# Patient Record
Sex: Male | Born: 1940 | Hispanic: No | Marital: Married | Smoking: Never smoker
Health system: Southern US, Community
[De-identification: ages and names within clinical notes are randomized; demographics above are authoritative.]

## PROBLEM LIST (undated history)

## (undated) DIAGNOSIS — I10 Essential (primary) hypertension: Secondary | ICD-10-CM

## (undated) DIAGNOSIS — C801 Malignant (primary) neoplasm, unspecified: Secondary | ICD-10-CM

---

## 1999-01-28 ENCOUNTER — Emergency Department (HOSPITAL_COMMUNITY): Admission: EM | Admit: 1999-01-28 | Discharge: 1999-01-28 | Payer: Self-pay | Admitting: Emergency Medicine

## 1999-01-28 ENCOUNTER — Encounter: Payer: Self-pay | Admitting: Emergency Medicine

## 2001-10-10 ENCOUNTER — Emergency Department (HOSPITAL_COMMUNITY): Admission: EM | Admit: 2001-10-10 | Discharge: 2001-10-11 | Payer: Self-pay | Admitting: Emergency Medicine

## 2001-11-21 ENCOUNTER — Ambulatory Visit (HOSPITAL_BASED_OUTPATIENT_CLINIC_OR_DEPARTMENT_OTHER): Admission: RE | Admit: 2001-11-21 | Discharge: 2001-11-21 | Payer: Self-pay | Admitting: Surgery

## 2003-02-08 ENCOUNTER — Emergency Department (HOSPITAL_COMMUNITY): Admission: AD | Admit: 2003-02-08 | Discharge: 2003-02-08 | Payer: Self-pay | Admitting: Internal Medicine

## 2005-09-11 ENCOUNTER — Emergency Department (HOSPITAL_COMMUNITY): Admission: EM | Admit: 2005-09-11 | Discharge: 2005-09-11 | Payer: Self-pay | Admitting: Emergency Medicine

## 2005-10-22 ENCOUNTER — Encounter: Admission: RE | Admit: 2005-10-22 | Discharge: 2005-10-22 | Payer: Self-pay | Admitting: Family Medicine

## 2016-03-25 ENCOUNTER — Emergency Department (HOSPITAL_COMMUNITY)
Admission: EM | Admit: 2016-03-25 | Discharge: 2016-03-25 | Disposition: A | Discharge status: Home / Self Care | Payer: Worker's Compensation | Attending: Emergency Medicine | Admitting: Emergency Medicine

## 2016-03-25 ENCOUNTER — Emergency Department (HOSPITAL_COMMUNITY): Payer: Worker's Compensation

## 2016-03-25 ENCOUNTER — Encounter (HOSPITAL_COMMUNITY): Payer: Self-pay

## 2016-03-25 DIAGNOSIS — M25461 Effusion, right knee: Secondary | ICD-10-CM | POA: Insufficient documentation

## 2016-03-25 DIAGNOSIS — Y990 Civilian activity done for income or pay: Secondary | ICD-10-CM | POA: Insufficient documentation

## 2016-03-25 DIAGNOSIS — I10 Essential (primary) hypertension: Secondary | ICD-10-CM | POA: Diagnosis not present

## 2016-03-25 DIAGNOSIS — X509XXA Other and unspecified overexertion or strenuous movements or postures, initial encounter: Secondary | ICD-10-CM | POA: Insufficient documentation

## 2016-03-25 DIAGNOSIS — Y9289 Other specified places as the place of occurrence of the external cause: Secondary | ICD-10-CM | POA: Diagnosis not present

## 2016-03-25 DIAGNOSIS — M7989 Other specified soft tissue disorders: Secondary | ICD-10-CM | POA: Diagnosis present

## 2016-03-25 DIAGNOSIS — Y9389 Activity, other specified: Secondary | ICD-10-CM | POA: Diagnosis not present

## 2016-03-25 HISTORY — DX: Essential (primary) hypertension: I10

## 2016-03-25 NOTE — ED Provider Notes (Signed)
[facility] DEPT Provider Note   CSN: [id number] Arrival date & time: 03/25/16  1723  By signing my name below, I, Sonum Patel, attest that this documentation has been prepared under the direction and in the presence of Blanchie Dessert, MD. Electronically Signed: Sonum Patel, Education administrator. 03/25/16. 7:14 PM.  History   Chief Complaint Chief Complaint  Patient presents with  . Knee Pain     The history is provided by the patient. No language interpreter was used.     HPI Comments: Terrence Rivera is a 76 y.o. male who presents to the Emergency Department complaining of constant right knee pain with associated swelling that began 6 days ago. He states he was pulling something when he heard a popping noise to the affected area. He states the pain is worse with ambulation and flexion of the knee. He has applied ice, used a knee brace, and taken OTC anti-inflammatories with mild relief. He denies numbness, weakness.   Past Medical History:  Diagnosis Date  . Hypertension     There are no active problems to display for this patient.   History reviewed. No pertinent surgical history.     Home Medications    Prior to Admission medications   Not on File    Family History History reviewed. No pertinent family history.  Social History Social History  Substance Use Topics  . Smoking status: Never Smoker  . Smokeless tobacco: Never Used  . Alcohol use Yes     Comment: occ/rare     Allergies   Patient has no known allergies.   Review of Systems Review of Systems  Musculoskeletal: Positive for arthralgias and joint swelling.  Neurological: Negative for weakness and numbness.  All other systems reviewed and are negative.    Physical Exam Updated Vital Signs BP (!) 202/115 (BP Location: Left Arm)   Pulse 93   Temp 98.6 F (37 C) (Oral)   Resp 18   Ht 5\' 7"  (1.702 m)   Wt 158 lb 12.8 oz (72 kg)   SpO2 97%   BMI 24.87 kg/m   Physical Exam    Constitutional: He is oriented to person, place, and time. He appears well-developed and well-nourished.  HENT:  Head: Normocephalic and atraumatic.  Cardiovascular: Normal rate.   Pulmonary/Chest: Effort normal.  Musculoskeletal: He exhibits tenderness.  Right knee has suprapatellar swelling and tenderness over medial joint line. Able to flex past 90 degrees. No popliteal tenderness. Normal sensation and movement of the right foot. Normal strength of the quadriceps.   Neurological: He is alert and oriented to person, place, and time.  Skin: Skin is warm and dry.  Psychiatric: He has a normal mood and affect.  Nursing note and vitals reviewed.    ED Treatments / Results  DIAGNOSTIC STUDIES: Oxygen Saturation is 97% on RA, normal by my interpretation.    COORDINATION OF CARE: 7:15 PM Discussed treatment plan with pt at bedside and pt agreed to plan.   Labs (all labs ordered are listed, but only abnormal results are displayed) Labs Reviewed - No data to display  EKG  EKG Interpretation None       Radiology Dg Knee Complete 4 Views Right  Result Date: 03/25/2016 CLINICAL DATA:  [place] on RIGHT knee, pain and swelling. EXAM: RIGHT KNEE - COMPLETE 4+ VIEW COMPARISON:  None. FINDINGS: No acute fracture deformity or dislocation. No advanced degenerative change for age. No destructive bony lesions. Faint periarticular calcifications compatible with CPPD. Large suprapatellar joint effusion. Minimal  vascular calcifications. IMPRESSION: Large suprapatellar joint effusion.  No acute osseous process. Electronically Signed   By: Elon Alas M.D.   On: 03/25/2016 18:21    Procedures Procedures (including critical care time)  Medications Ordered in ED Medications - No data to display   Initial Impression / Assessment and Plan / ED Course  I have reviewed the triage vital signs and the nursing notes.  Pertinent labs & imaging results that were available during my care of the  patient were reviewed by me and considered in my medical decision making (see chart for details).     Patient with injury to his right knee while at work 6 days ago. He has had persistent knee swelling and pain with flexing the knee and walking. He has been using a knee sleeve and intermittent ibuprofen as well as ice with no significant improvement. He denies any weakness in the leg and is able to ambulate. X-ray shows a large suprapatellar joint effusion which is present on exam. Patient can flex the knee to approximately 90 and has no evidence of deformity. No signs of a septic joint and no evidence of dislocation or fracture. Most likely bad sprain. Instructed patient to wear his knee sleeve continuously and to ice and elevate when he can. Given follow-up with orthopedics. Patient was also hypertensive when he arrived here. He has not taken his lisinopril today but will recheck his blood pressure.  Which was 187/100 and told pt to take his lisinopril upon arrival home.  Final Clinical Impressions(s) / ED Diagnoses   Final diagnoses:  Effusion of right knee  Essential hypertension    New Prescriptions New Prescriptions   No medications on file   I personally performed the services described in this documentation, which was scribed in my presence.  The recorded information has been reviewed and considered.    Blanchie Dessert, MD 03/25/16 2018

## 2016-03-25 NOTE — ED Triage Notes (Signed)
Pt reports right knee pain, possible injury at work. Pt states he heard a "pop." Some mild swelling noted and patient unable to bend the knee without pain.

## 2016-03-25 NOTE — Discharge Instructions (Signed)
Wear your knee brace constantly and ice and elevate when you can.  Take Aleve 2 times a day for 5 days.  Take your lisinopril when you get home for your blood pressure

## 2016-04-27 ENCOUNTER — Encounter (INDEPENDENT_AMBULATORY_CARE_PROVIDER_SITE_OTHER): Payer: Self-pay | Admitting: Orthopaedic Surgery

## 2016-04-27 ENCOUNTER — Ambulatory Visit (INDEPENDENT_AMBULATORY_CARE_PROVIDER_SITE_OTHER): Payer: Worker's Compensation | Admitting: Orthopaedic Surgery

## 2016-04-27 DIAGNOSIS — M11261 Other chondrocalcinosis, right knee: Secondary | ICD-10-CM | POA: Diagnosis not present

## 2016-04-27 NOTE — Progress Notes (Signed)
   Office Visit Note   Patient: Terrence Rivera           Date of Birth: March 10, 1940           MRN: [id number] Visit Date: 04/27/2016              Requested by: No referring provider defined for this encounter. PCP: No PCP Per Patient   Assessment & Plan: Visit Diagnoses:  1. Pseudogout of right knee     Plan: knee aspirated and injected.  CPPD flare up.  Out of work this week.  F/u prn.    Follow-Up Instructions: Return if symptoms worsen or fail to improve.   Orders:  No orders of the defined types were placed in this encounter.  No orders of the defined types were placed in this encounter.     Procedures: Large Joint Inj Date/Time: 04/27/2016 2:05 PM Performed by: Leandrew Koyanagi Authorized by: Leandrew Koyanagi   Consent Given by:  Patient Timeout: prior to procedure the correct patient, procedure, and site was verified   Indications:  Pain Location:  Knee Site:  R knee Prep: patient was prepped and draped in usual sterile fashion   Needle Size:  22 G Ultrasound Guidance: No   Fluoroscopic Guidance: No   Arthrogram: No   Aspiration Attempted: Yes   Aspirate amount (mL):  55 Aspirate:  Clear Patient tolerance:  Patient tolerated the procedure well with no immediate complications     Clinical Data: No additional findings.   Subjective: Chief Complaint  Patient presents with  . Right Knee - Pain    76 yo male here for right knee pain and swelling since 03/25/16.  He injured his knee at work.  He's had swelling, pain, cannot bend knee and limping.  Currently doing light duty.  Has used ice with some relief.      Review of Systems  Constitutional: Negative.   All other systems reviewed and are negative.    Objective: Vital Signs: There were no vitals taken for this visit.  Physical Exam  Constitutional: He is oriented to person, place, and time. He appears well-developed and well-nourished.  HENT:  Head: Normocephalic and atraumatic.  Eyes:  Pupils are equal, round, and reactive to light.  Neck: Neck supple.  Pulmonary/Chest: Effort normal.  Abdominal: Soft.  Musculoskeletal: Normal range of motion.  Neurological: He is alert and oriented to person, place, and time.  Skin: Skin is warm.  Psychiatric: He has a normal mood and affect. His behavior is normal. Judgment and thought content normal.  Nursing note and vitals reviewed.   Ortho Exam Right knee - large effusion - otherwise benign exam  Specialty Comments:  No specialty comments available.  Imaging: No results found.   PMFS History: Patient Active Problem List   Diagnosis Date Noted  . Pseudogout of right knee 04/27/2016   Past Medical History:  Diagnosis Date  . Hypertension     No family history on file.  No past surgical history on file. Social History   Occupational History  . Not on file.   Social History Main Topics  . Smoking status: Never Smoker  . Smokeless tobacco: Never Used  . Alcohol use Yes     Comment: occ/rare  . Drug use: Unknown  . Sexual activity: Not on file

## 2016-04-27 NOTE — Addendum Note (Signed)
Addended by: Precious Bard on: 04/27/2016 04:46 PM   Modules accepted: Orders

## 2016-04-28 LAB — SYNOVIAL CELL COUNT + DIFF, W/ CRYSTALS
Basophils, %: 0 %
Eosinophils-Synovial: 0 % (ref 0–2)
Lymphocytes-Synovial Fld: 57 % (ref 0–74)
MONOCYTE/MACROPHAGE: 27 % (ref 0–69)
Neutrophil, Synovial: 14 % (ref 0–24)
SYNOVIOCYTES, %: 2 % (ref 0–15)
WBC, SYNOVIAL: 140 {cells}/uL (ref <150)

## 2016-04-30 ENCOUNTER — Telehealth (INDEPENDENT_AMBULATORY_CARE_PROVIDER_SITE_OTHER): Payer: Self-pay | Admitting: Orthopaedic Surgery

## 2016-04-30 NOTE — Telephone Encounter (Signed)
JAMIE WEBB, P.A. @ [facility] CALLED REQUESTING NOTES. FAXED [phone]. [place] [phone]

## 2016-05-08 LAB — BODY FLUID CULTURE
GRAM STAIN: NONE SEEN
GRAM STAIN: NONE SEEN
Organism ID, Bacteria: NO GROWTH

## 2018-08-22 DIAGNOSIS — R040 Epistaxis: Secondary | ICD-10-CM | POA: Insufficient documentation

## 2019-06-13 ENCOUNTER — Emergency Department (HOSPITAL_COMMUNITY): Payer: BC Managed Care – PPO

## 2019-06-13 ENCOUNTER — Other Ambulatory Visit: Payer: Self-pay

## 2019-06-13 ENCOUNTER — Encounter (HOSPITAL_COMMUNITY): Payer: Self-pay | Admitting: Pediatrics

## 2019-06-13 ENCOUNTER — Emergency Department (HOSPITAL_COMMUNITY)
Admission: EM | Admit: 2019-06-13 | Discharge: 2019-06-14 | Disposition: A | Discharge status: Home / Self Care | Payer: BC Managed Care – PPO | Attending: Emergency Medicine | Admitting: Emergency Medicine

## 2019-06-13 DIAGNOSIS — S46911A Strain of unspecified muscle, fascia and tendon at shoulder and upper arm level, right arm, initial encounter: Secondary | ICD-10-CM | POA: Insufficient documentation

## 2019-06-13 DIAGNOSIS — X500XXA Overexertion from strenuous movement or load, initial encounter: Secondary | ICD-10-CM | POA: Diagnosis not present

## 2019-06-13 DIAGNOSIS — I10 Essential (primary) hypertension: Secondary | ICD-10-CM | POA: Diagnosis not present

## 2019-06-13 DIAGNOSIS — Y939 Activity, unspecified: Secondary | ICD-10-CM | POA: Insufficient documentation

## 2019-06-13 DIAGNOSIS — Z79899 Other long term (current) drug therapy: Secondary | ICD-10-CM | POA: Insufficient documentation

## 2019-06-13 DIAGNOSIS — Y999 Unspecified external cause status: Secondary | ICD-10-CM | POA: Insufficient documentation

## 2019-06-13 DIAGNOSIS — Y929 Unspecified place or not applicable: Secondary | ICD-10-CM | POA: Diagnosis not present

## 2019-06-13 DIAGNOSIS — R52 Pain, unspecified: Secondary | ICD-10-CM

## 2019-06-13 DIAGNOSIS — S4991XA Unspecified injury of right shoulder and upper arm, initial encounter: Secondary | ICD-10-CM | POA: Diagnosis present

## 2019-06-13 NOTE — ED Triage Notes (Signed)
Patient stated some car battery fell on his right shoulder approx 25 lbs. C/O pain; worst with movement

## 2019-06-13 NOTE — ED Notes (Signed)
Pt not responding for vitals  

## 2019-06-14 LAB — I-STAT CHEM 8, ED
BUN: 23 mg/dL (ref 8–23)
Calcium, Ion: 1.02 mmol/L — ABNORMAL LOW (ref 1.15–1.40)
Chloride: 102 mmol/L (ref 98–111)
Creatinine, Ser: 0.9 mg/dL (ref 0.61–1.24)
Glucose, Bld: 118 mg/dL — ABNORMAL HIGH (ref 70–99)
HCT: 41 % (ref 39.0–52.0)
Hemoglobin: 13.9 g/dL (ref 13.0–17.0)
Potassium: 3.6 mmol/L (ref 3.5–5.1)
Sodium: 137 mmol/L (ref 135–145)
TCO2: 29 mmol/L (ref 22–32)

## 2019-06-14 MED ORDER — LISINOPRIL 20 MG PO TABS
20.0000 mg | ORAL_TABLET | Freq: Once | ORAL | Status: AC
  Administered 2019-06-14: 20 mg via ORAL
  Filled 2019-06-14: qty 1

## 2019-06-14 MED ORDER — ACETAMINOPHEN 500 MG PO TABS
1000.0000 mg | ORAL_TABLET | Freq: Once | ORAL | Status: AC
Start: 2019-06-14 — End: 2019-06-14
  Administered 2019-06-14: 01:00:00 1000 mg via ORAL
  Filled 2019-06-14: qty 2

## 2019-06-14 MED ORDER — LISINOPRIL 20 MG PO TABS: 20.0000 mg | ORAL_TABLET | Freq: Every day | ORAL | 0 refills | Status: DC

## 2019-06-14 NOTE — ED Notes (Signed)
Pt c/o R shoulder pain, worse with movement after car battery fell on it. CMS intact. Pt hypertensive with SBP in 200s. Pt states he is supposed to take lisinopril but has been off of it for 1 month "because last time I went to the doctor, it cost me 200 dollars."

## 2019-06-14 NOTE — Discharge Instructions (Signed)
Puede tomar Motrin (Ibuprofen) o Aleve (Naproxen), Acetaminophen (Tylenol), crema para los musculos como SalonPas, Icy Hot, Bengay, etc. Puede estrechar, ponerce hielo o comprecion de calor, o que le den masaje. ° °

## 2019-06-14 NOTE — ED Provider Notes (Signed)
[facility] EMERGENCY DEPARTMENT Provider Note  CSN: [id number] Arrival date & time: 06/13/19 1544  Chief Complaint(s) Shoulder Pain  HPI Terrence Rivera is a 79 y.o. male with a history of hypertension on lisinopril who presents to the emergency department with right shoulder pain.  This occurred earlier today while he was trying to lift a battery off of the shelf.  Reports that while trying to lift it, he immediately felt pain in his right shoulder.  There was no associated trauma or falls.  Pain felt like a muscle spasm which has not gone away.  Worse with range of motion, palpation of the parascapular muscles on the right.  No numbness or tingling.  No neck pain or back pain.  No chest pain or shortness of breath.  He denies any other physical complaints.  HPI   Past Medical History Past Medical History:  Diagnosis Date  . Hypertension    Patient Active Problem List   Diagnosis Date Noted  . Pseudogout of right knee 04/27/2016   Home Medication(s) Prior to Admission medications   Medication Sig Start Date End Date Taking? Authorizing Provider  lisinopril (ZESTRIL) 20 MG tablet Take 1 tablet (20 mg total) by mouth daily. 06/14/19   Fatima Blank, MD                                                                                                                                    Past Surgical History History reviewed. No pertinent surgical history. Family History No family history on file.  Social History Social History   Tobacco Use  . Smoking status: Never Smoker  . Smokeless tobacco: Never Used  Substance Use Topics  . Alcohol use: Yes    Comment: occ/rare  . Drug use: Not on file   Allergies Patient has no known allergies.  Review of Systems Review of Systems All other systems are reviewed and are negative for acute change except as noted in the HPI  Physical Exam Vital Signs  I have reviewed the triage vital signs BP (!)  216/107   Pulse 82   Temp 98.3 F (36.8 C) (Oral)   Resp 18   Ht 5\' 6"  (1.676 m)   Wt 65.8 kg   SpO2 98%   BMI 23.40 kg/m   Physical Exam Vitals reviewed.  Constitutional:      General: He is not in acute distress.    Appearance: He is well-developed. He is not diaphoretic.  HENT:     Head: Normocephalic and atraumatic.     Jaw: No trismus.     Right Ear: External ear normal.     Left Ear: External ear normal.     Nose: Nose normal.  Eyes:     General: No scleral icterus.    Conjunctiva/sclera: Conjunctivae normal.  Neck:     Trachea: Phonation normal.  Cardiovascular:     Rate and  Rhythm: Normal rate and regular rhythm.  Pulmonary:     Effort: Pulmonary effort is normal. No respiratory distress.     Breath sounds: No stridor.  Abdominal:     General: There is no distension.  Musculoskeletal:        General: Normal range of motion.     Cervical back: Normal range of motion.     Thoracic back: Spasms and tenderness present. No bony tenderness.       Back:  Neurological:     Mental Status: He is alert and oriented to person, place, and time.  Psychiatric:        Behavior: Behavior normal.     ED Results and Treatments Labs (all labs ordered are listed, but only abnormal results are displayed) Labs Reviewed  I-STAT CHEM 8, ED - Abnormal; Notable for the following components:      Result Value   Glucose, Bld 118 (*)    Calcium, Ion 1.02 (*)    All other components within normal limits                                                                                                                         EKG  EKG Interpretation  Date/Time:    Ventricular Rate:    PR Interval:    QRS Duration:   QT Interval:    QTC Calculation:   R Axis:     Text Interpretation:        Radiology DG Shoulder Right  Result Date: 06/13/2019 CLINICAL DATA:  Heavy object fell on right shoulder.  Pain EXAM: RIGHT SHOULDER - 2+ VIEW COMPARISON:  None FINDINGS: Degenerative  changes in the right AC and glenohumeral joints with joint space narrowing and spurring. No acute bony abnormality. Specifically, no fracture, subluxation, or dislocation. IMPRESSION: Degenerative changes.  No acute bony abnormality. Electronically Signed   By: Rolm Baptise M.D.   On: 06/13/2019 17:26    Pertinent labs & imaging results that were available during my care of the patient were reviewed by me and considered in my medical decision making (see chart for details).  Medications Ordered in ED Medications  acetaminophen (TYLENOL) tablet 1,000 mg (1,000 mg Oral Given 06/14/19 0120)  lisinopril (ZESTRIL) tablet 20 mg (20 mg Oral Given 06/14/19 0120)  Procedures Procedures  (including critical care time)  Medical Decision Making / ED Course I have reviewed the nursing notes for this encounter and the patient's prior records (if available in EHR or on provided paperwork).   Terrence Rivera was evaluated in Emergency Department on 06/14/2019 for the symptoms described in the history of present illness. He was evaluated in the context of the global COVID-19 pandemic, which necessitated consideration that the patient might be at risk for infection with the SARS-CoV-2 virus that causes COVID-19. Institutional protocols and algorithms that pertain to the evaluation of patients at risk for COVID-19 are in a state of rapid change based on information released by regulatory bodies including the CDC and federal and state organizations. These policies and algorithms were followed during the patient's care in the ED.  Right shoulder pain consistent with muscle spasm/strain.  Plain film was obtained in triage and negative.  Symptomatic and supportive management care recommended.  Patient was noted to be hypertensive.  Reports that he has been off of his lisinopril for 1  month after he decided to fire his PCP.  He has not reestablished care with anybody yet.  Denies any chest pain or shortness of breath concerning for cardiac endorgan damage.  Chem-8 obtained in renal function intact.  Given dose of lisinopril here in the emergency department.  Will prescribe him 1 to 2 months worth of lisinopril until he is able to establish care with a primary care provider.        Final Clinical Impression(s) / ED Diagnoses Final diagnoses:  Muscle strain of right shoulder, initial encounter   The patient appears reasonably screened and/or stabilized for discharge and I doubt any other medical condition or other [facility] requiring further screening, evaluation, or treatment in the ED at this time prior to discharge. Safe for discharge with strict return precautions.  Disposition: Discharge  Condition: Good  I have discussed the results, Dx and Tx plan with the patient/family who expressed understanding and agree(s) with the plan. Discharge instructions discussed at length. The patient/family was given strict return precautions who verbalized understanding of the instructions. No further questions at time of discharge.    ED Discharge Orders         Ordered    lisinopril (ZESTRIL) 20 MG tablet  Daily     06/14/19 0215           Follow Up: Primary care provider  Schedule an appointment as soon as possible for a visit  If you do not have a primary care physician, contact [facility] at [phone] for referral      This chart was dictated using voice recognition software.  Despite best efforts to proofread,  errors can occur which can change the documentation meaning.   Fatima Blank, MD 06/14/19 [phone]

## 2019-06-14 NOTE — ED Notes (Signed)
Patient verbalizes understanding of discharge instructions, follow up care, and prescription medications. Opportunity for questioning and answers were provided. All questions answered completely. Armband removed by staff, pt discharged from ED.  Ambulatory from ED with strong, steady gait

## 2019-06-14 NOTE — ED Notes (Signed)
ED Provider at bedside. 

## 2019-06-14 NOTE — ED Notes (Addendum)
Meds given per MAR. Name/DOB verified with pt Istat chem drawn, labeled with 2 pt identifiers, and brought to lab

## 2020-09-19 IMAGING — DX DG SHOULDER 2+V*R*
3 series · 3 of 3 positions shown · non-contrast
Comparison: None

CLINICAL DATA: Heavy object fell on right shoulder.  Pain

EXAM:
RIGHT SHOULDER - 2+ VIEW

[w shoulder external right]
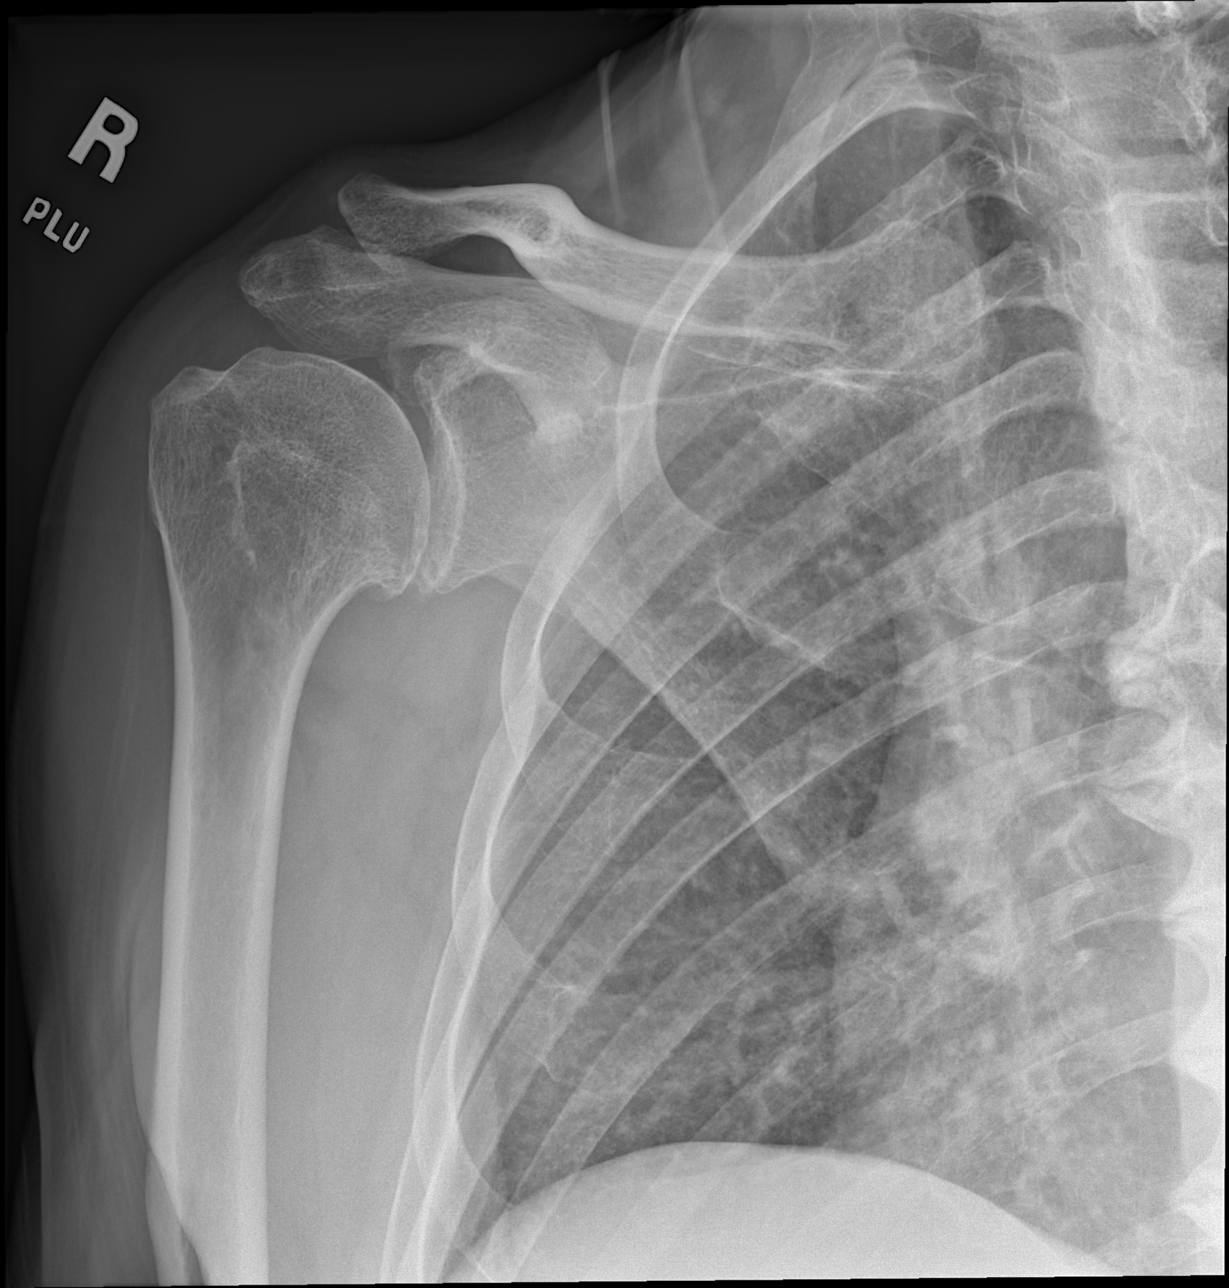

[w shoulder y-view right]
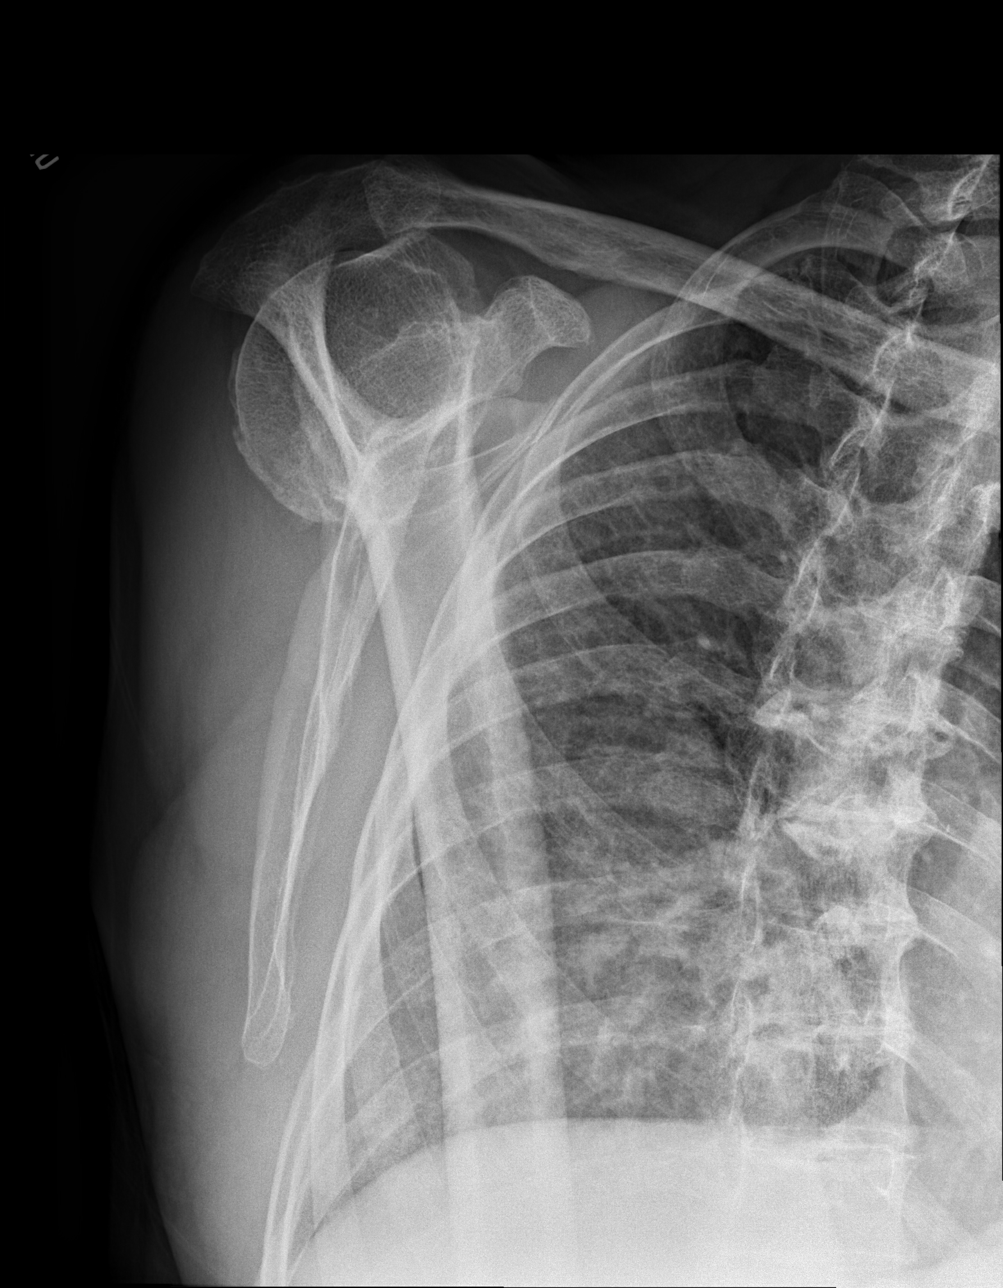

[x shoulder axillary right]
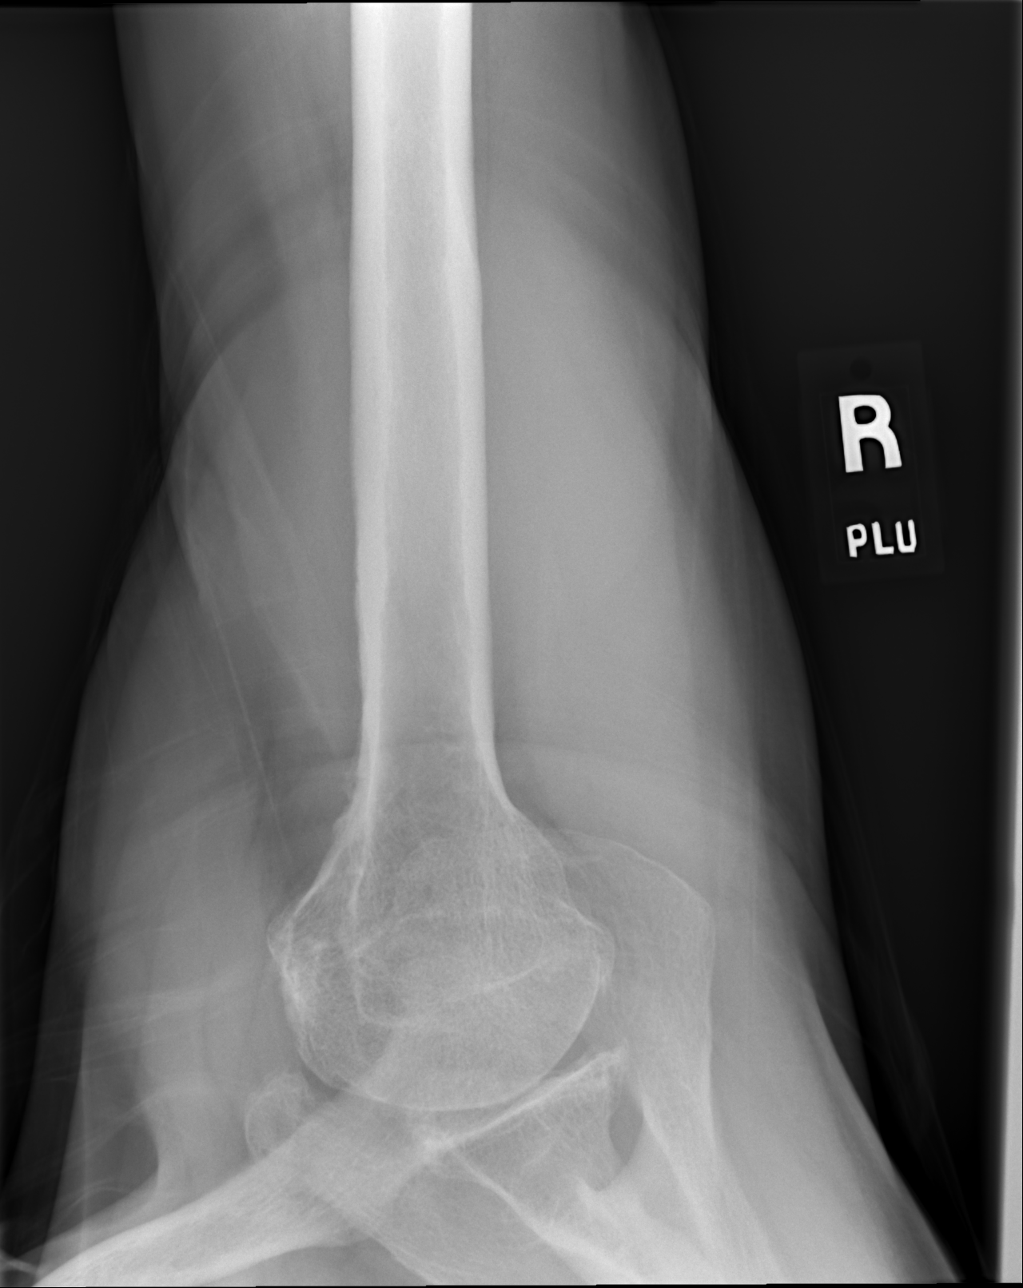

[3 of 3 positions shown; findings below may reference images not displayed]

FINDINGS: Degenerative changes in the right AC and glenohumeral joints with
joint space narrowing and spurring. No acute bony abnormality.
Specifically, no fracture, subluxation, or dislocation.
IMPRESSION: Degenerative changes.  No acute bony abnormality.

## 2021-09-17 ENCOUNTER — Other Ambulatory Visit: Payer: Self-pay | Admitting: Urology

## 2021-09-21 ENCOUNTER — Encounter (HOSPITAL_COMMUNITY): Payer: Self-pay

## 2021-09-21 ENCOUNTER — Encounter (HOSPITAL_COMMUNITY)
Admission: RE | Admit: 2021-09-21 | Discharge: 2021-09-21 | Disposition: A | Discharge status: Home / Self Care | Payer: Medicare (Managed Care) | Source: Ambulatory Visit | Attending: Urology | Admitting: Urology

## 2021-09-21 ENCOUNTER — Other Ambulatory Visit: Payer: Self-pay

## 2021-09-21 DIAGNOSIS — M11261 Other chondrocalcinosis, right knee: Secondary | ICD-10-CM | POA: Insufficient documentation

## 2021-09-21 DIAGNOSIS — Z01812 Encounter for preprocedural laboratory examination: Secondary | ICD-10-CM | POA: Insufficient documentation

## 2021-09-21 LAB — CBC
HCT: 36.1 % — ABNORMAL LOW (ref 39.0–52.0)
Hemoglobin: 11.5 g/dL — ABNORMAL LOW (ref 13.0–17.0)
MCH: 27.6 pg (ref 26.0–34.0)
MCHC: 31.9 g/dL (ref 30.0–36.0)
MCV: 86.8 fL (ref 80.0–100.0)
Platelets: 238 10*3/uL (ref 150–400)
RBC: 4.16 MIL/uL — ABNORMAL LOW (ref 4.22–5.81)
RDW: 15.9 % — ABNORMAL HIGH (ref 11.5–15.5)
WBC: 8.4 10*3/uL (ref 4.0–10.5)
nRBC: 0 % (ref 0.0–0.2)

## 2021-09-21 LAB — BASIC METABOLIC PANEL
Anion gap: 10 (ref 5–15)
BUN: 30 mg/dL — ABNORMAL HIGH (ref 8–23)
CO2: 23 mmol/L (ref 22–32)
Calcium: 9.4 mg/dL (ref 8.9–10.3)
Chloride: 103 mmol/L (ref 98–111)
Creatinine, Ser: 0.89 mg/dL (ref 0.61–1.24)
GFR, Estimated: >60 mL/min (ref >60)
Glucose, Bld: 112 mg/dL — ABNORMAL HIGH (ref 70–99)
Potassium: 4.2 mmol/L (ref 3.5–5.1)
Sodium: 136 mmol/L (ref 135–145)

## 2021-09-21 NOTE — Progress Notes (Addendum)
Anesthesia Review:  PCP: DR Kellie Shropshire in [facility]  Cardiologist : none  Chest x-ray : EKG :09/21/21  Echo : Stress test: Cardiac Cath :  Activity level: can do a flight of stairs wtihout difficutly  Sleep Study/ CPAP : none  Fasting Blood Sugar :      / Checks Blood Sugar -- times a day:   Blood Thinner/ Instructions /Last Dose: ASA / Instructions/ Last Dose :   Understands and speaks [place].  Understands most English except for medical terminology.  Interpreter requested DOS.  Wife to be here and does not speak [place].   PT was 20 minutres late for preop appt.

## 2021-09-21 NOTE — Progress Notes (Signed)
DUE TO COVID-19 ONLY ONE VISITOR IS ALLOWED TO COME WITH YOU AND STAY IN THE WAITING ROOM ONLY DURING PRE OP AND PROCEDURE DAY OF SURGERY.  2 VISITOR  MAY VISIT WITH YOU AFTER SURGERY IN YOUR PRIVATE ROOM DURING VISITING HOURS ONLY! YOU MAY HAVE ONE PERSON SPEND THE NITE WITH YOU IN YOUR ROOM AFTER SURGERY.     Your procedure is scheduled on:            09/23/2021   Report to [facility] Main  Entrance   Report to admitting at    0930             AM DO NOT BRING INSURANCE CARD, PICTURE ID OR WALLET DAY OF SURGERY.      Call this number if you have problems the morning of surgery [phone]    REMEMBER: NO  SOLID FOODS , CANDY, GUM OR MINTS AFTER [place] .       Marland Kitchen CLEAR LIQUIDS UNTIL   0830am              DAY OF SURGERY.       CLEAR LIQUID DIET   Foods Allowed      WATER BLACK COFFEE ( SUGAR [place], NO MILK, CREAM OR CREAMER) REGULAR AND DECAF  TEA ( [place] NO MILK, CREAM, OR CREAMER) REGULAR AND DECAF  PLAIN JELLO ( NO RED)  FRUIT ICES ( NO RED, NO FRUIT PULP)  POPSICLES ( NO RED)  JUICE- APPLE, WHITE GRAPE AND WHITE CRANBERRY  SPORT DRINK LIKE GATORADE ( NO RED)  CLEAR BROTH ( VEGETABLE , CHICKEN OR BEEF)                                                                     BRUSH YOUR TEETH MORNING OF SURGERY AND RINSE YOUR MOUTH OUT, NO CHEWING GUM CANDY OR MINTS.     Take these medicines the morning of surgery with A SIP OF WATER:    DO NOT TAKE ANY DIABETIC MEDICATIONS DAY OF YOUR SURGERY                               You may not have any metal on your body including hair pins and              piercings  Do not wear jewelry, make-up, lotions, powders or perfumes, deodorant             Do not wear nail polish on your fingernails.              IF YOU ARE A MALE AND WANT TO SHAVE UNDER ARMS OR LEGS PRIOR TO SURGERY YOU MUST DO SO AT LEAST 48 HOURS PRIOR TO SURGERY.              Men may shave face and neck.   Do not bring valuables to the  hospital. [place].  Contacts, dentures or bridgework may not be worn into surgery.  Leave suitcase in the car. After surgery it may be brought to your room.  Patients discharged the day of surgery will not be allowed to drive home. IF YOU ARE HAVING SURGERY AND GOING HOME THE SAME DAY, YOU MUST HAVE AN ADULT TO DRIVE YOU HOME AND BE WITH YOU FOR 24 HOURS. YOU MAY GO HOME BY TAXI OR UBER OR ORTHERWISE, BUT AN ADULT MUST ACCOMPANY YOU HOME AND STAY WITH YOU FOR 24 HOURS.                Please read over the following fact sheets you were given: _____________________________________________________________________  [facility] - Preparing for Surgery Before surgery, you can play an important role.  Because skin is not sterile, your skin needs to be as free of germs as possible.  You can reduce the number of germs on your skin by washing with CHG (chlorahexidine gluconate) soap before surgery.  CHG is an antiseptic cleaner which kills germs and bonds with the skin to continue killing germs even after washing. Please DO NOT use if you have an allergy to CHG or antibacterial soaps.  If your skin becomes reddened/irritated stop using the CHG and inform your nurse when you arrive at Short Stay. Do not shave (including legs and underarms) for at least 48 hours prior to the first CHG shower.  You may shave your face/neck. Please follow these instructions carefully:  1.  Shower with CHG Soap the night before surgery and the  morning of Surgery.  2.  If you choose to wash your hair, wash your hair first as usual with your  normal  shampoo.  3.  After you shampoo, rinse your hair and body thoroughly to remove the  shampoo.                           4.  Use CHG as you would any other liquid soap.  You can apply chg directly  to the skin and wash                       Gently with a scrungie or clean washcloth.  5.  Apply the CHG Soap to your body ONLY FROM  THE NECK DOWN.   Do not use on face/ open                           Wound or open sores. Avoid contact with eyes, ears mouth and genitals (private parts).                       Wash face,  Genitals (private parts) with your normal soap.             6.  Wash thoroughly, paying special attention to the area where your surgery  will be performed.  7.  Thoroughly rinse your body with warm water from the neck down.  8.  DO NOT shower/wash with your normal soap after using and rinsing off  the CHG Soap.                9.  Pat yourself dry with a clean towel.            10.  Wear clean pajamas.            11.  Place clean sheets on your bed the night of your first shower and do not  sleep with pets. Day of Surgery : Do not apply any lotions/deodorants the  morning of surgery.  Please wear clean clothes to the hospital/surgery center.  FAILURE TO FOLLOW THESE INSTRUCTIONS MAY RESULT IN THE CANCELLATION OF YOUR SURGERY PATIENT SIGNATURE_________________________________  NURSE SIGNATURE__________________________________  ________________________________________________________________________

## 2021-09-22 ENCOUNTER — Encounter (HOSPITAL_COMMUNITY): Payer: Self-pay | Admitting: Urology

## 2021-09-23 ENCOUNTER — Other Ambulatory Visit: Payer: Self-pay

## 2021-09-23 ENCOUNTER — Encounter (HOSPITAL_COMMUNITY)
Admission: RE | Disposition: A | Discharge status: Home / Self Care | Payer: Self-pay | Source: Home / Self Care | Attending: Urology

## 2021-09-23 ENCOUNTER — Ambulatory Visit (HOSPITAL_COMMUNITY): Payer: Medicare (Managed Care) | Admitting: Anesthesiology

## 2021-09-23 ENCOUNTER — Ambulatory Visit (HOSPITAL_BASED_OUTPATIENT_CLINIC_OR_DEPARTMENT_OTHER): Payer: Medicare (Managed Care) | Admitting: Anesthesiology

## 2021-09-23 ENCOUNTER — Observation Stay (HOSPITAL_COMMUNITY)
Admission: RE | Admit: 2021-09-23 | Discharge: 2021-09-24 | Disposition: A | Discharge status: Home / Self Care | Payer: Medicare (Managed Care) | Attending: Urology | Admitting: Urology

## 2021-09-23 ENCOUNTER — Encounter (HOSPITAL_COMMUNITY): Payer: Self-pay | Admitting: Urology

## 2021-09-23 DIAGNOSIS — C679 Malignant neoplasm of bladder, unspecified: Secondary | ICD-10-CM | POA: Diagnosis not present

## 2021-09-23 DIAGNOSIS — Z79899 Other long term (current) drug therapy: Secondary | ICD-10-CM | POA: Diagnosis not present

## 2021-09-23 DIAGNOSIS — M11261 Other chondrocalcinosis, right knee: Secondary | ICD-10-CM

## 2021-09-23 DIAGNOSIS — D494 Neoplasm of unspecified behavior of bladder: Secondary | ICD-10-CM

## 2021-09-23 DIAGNOSIS — R310 Gross hematuria: Secondary | ICD-10-CM | POA: Diagnosis not present

## 2021-09-23 DIAGNOSIS — D4959 Neoplasm of unspecified behavior of other genitourinary organ: Secondary | ICD-10-CM

## 2021-09-23 HISTORY — PX: TRANSURETHRAL RESECTION OF PROSTATE: SHX73

## 2021-09-23 HISTORY — PX: CYSTOSCOPY: SHX5120

## 2021-09-23 LAB — BASIC METABOLIC PANEL
Anion gap: 10 (ref 5–15)
BUN: 27 mg/dL — ABNORMAL HIGH (ref 8–23)
CO2: 24 mmol/L (ref 22–32)
Calcium: 8.7 mg/dL — ABNORMAL LOW (ref 8.9–10.3)
Chloride: 102 mmol/L (ref 98–111)
Creatinine, Ser: 0.99 mg/dL (ref 0.61–1.24)
GFR, Estimated: >60 mL/min (ref >60)
Glucose, Bld: 215 mg/dL — ABNORMAL HIGH (ref 70–99)
Potassium: 3.6 mmol/L (ref 3.5–5.1)
Sodium: 136 mmol/L (ref 135–145)

## 2021-09-23 SURGERY — CYSTOSCOPY
Anesthesia: General

## 2021-09-23 MED ORDER — OXYCODONE HCL 5 MG/5ML PO SOLN: 5.0000 mg | Freq: Once | ORAL | Status: DC | PRN

## 2021-09-23 MED ORDER — SENNOSIDES-DOCUSATE SODIUM 8.6-50 MG PO TABS
2.0000 | ORAL_TABLET | Freq: Every day | ORAL | Status: DC
Start: 2021-09-23 — End: 2021-09-24
  Administered 2021-09-23: 2 via ORAL
  Filled 2021-09-23: qty 2

## 2021-09-23 MED ORDER — ESMOLOL HCL 100 MG/10ML IV SOLN
INTRAVENOUS | Status: DC | PRN
  Administered 2021-09-23: 10 mg via INTRAVENOUS

## 2021-09-23 MED ORDER — CEFAZOLIN SODIUM-DEXTROSE 2-4 GM/100ML-% IV SOLN
INTRAVENOUS | Status: AC
  Filled 2021-09-23: qty 100

## 2021-09-23 MED ORDER — LIDOCAINE HCL (CARDIAC) PF 100 MG/5ML IV SOSY
PREFILLED_SYRINGE | INTRAVENOUS | Status: DC | PRN
  Administered 2021-09-23: 60 mg via INTRAVENOUS

## 2021-09-23 MED ORDER — SODIUM CHLORIDE 0.9 % IR SOLN
3000.0000 mL | Status: DC
Start: 2021-09-23 — End: 2021-09-24
  Administered 2021-09-23 – 2021-09-24 (×3): 3000 mL

## 2021-09-23 MED ORDER — OXYCODONE HCL 5 MG PO TABS: 5.0000 mg | ORAL_TABLET | Freq: Once | ORAL | Status: DC | PRN

## 2021-09-23 MED ORDER — SUGAMMADEX SODIUM 500 MG/5ML IV SOLN
INTRAVENOUS | Status: DC | PRN
  Administered 2021-09-23: 200 mg via INTRAVENOUS

## 2021-09-23 MED ORDER — ONDANSETRON HCL 4 MG/2ML IJ SOLN
INTRAMUSCULAR | Status: DC | PRN
  Administered 2021-09-23: 4 mg via INTRAVENOUS

## 2021-09-23 MED ORDER — SODIUM CHLORIDE 0.9 % IR SOLN
Status: DC | PRN
  Administered 2021-09-23 (×10): 3000 mL

## 2021-09-23 MED ORDER — FENTANYL CITRATE (PF) 100 MCG/2ML IJ SOLN
INTRAMUSCULAR | Status: AC
  Filled 2021-09-23: qty 2

## 2021-09-23 MED ORDER — FENTANYL CITRATE (PF) 100 MCG/2ML IJ SOLN
INTRAMUSCULAR | Status: DC | PRN
  Administered 2021-09-23 (×3): 50 ug via INTRAVENOUS

## 2021-09-23 MED ORDER — SODIUM CHLORIDE 0.9 % IV SOLN: INTRAVENOUS | Status: DC

## 2021-09-23 MED ORDER — CEFAZOLIN SODIUM-DEXTROSE 2-4 GM/100ML-% IV SOLN: 2.0000 g | INTRAVENOUS | Status: DC

## 2021-09-23 MED ORDER — ACETAMINOPHEN 325 MG PO TABS
650.0000 mg | ORAL_TABLET | ORAL | Status: DC | PRN
Start: 2021-09-23 — End: 2021-09-24

## 2021-09-23 MED ORDER — CHLORHEXIDINE GLUCONATE 0.12 % MT SOLN
15.0000 mL | Freq: Once | OROMUCOSAL | Status: AC
  Administered 2021-09-23: 15 mL via OROMUCOSAL

## 2021-09-23 MED ORDER — ORAL CARE MOUTH RINSE: 15.0000 mL | Freq: Once | OROMUCOSAL | Status: AC

## 2021-09-23 MED ORDER — PROPOFOL 10 MG/ML IV BOLUS
INTRAVENOUS | Status: DC | PRN
  Administered 2021-09-23: 20 mg via INTRAVENOUS
  Administered 2021-09-23: 100 mg via INTRAVENOUS
  Administered 2021-09-23: 30 mg via INTRAVENOUS

## 2021-09-23 MED ORDER — ONDANSETRON HCL 4 MG/2ML IJ SOLN
4.0000 mg | INTRAMUSCULAR | Status: DC | PRN
  Administered 2021-09-23: 4 mg via INTRAVENOUS
  Filled 2021-09-23: qty 2

## 2021-09-23 MED ORDER — HYDRALAZINE HCL 20 MG/ML IJ SOLN
5.0000 mg | INTRAMUSCULAR | Status: DC | PRN
  Administered 2021-09-23: 5 mg via INTRAVENOUS

## 2021-09-23 MED ORDER — MORPHINE SULFATE (PF) 2 MG/ML IV SOLN: 2.0000 mg | INTRAVENOUS | Status: DC | PRN

## 2021-09-23 MED ORDER — LACTATED RINGERS IV SOLN: INTRAVENOUS | Status: DC

## 2021-09-23 MED ORDER — HYDRALAZINE HCL 20 MG/ML IJ SOLN
INTRAMUSCULAR | Status: AC
  Filled 2021-09-23: qty 1

## 2021-09-23 MED ORDER — DIPHENHYDRAMINE HCL 50 MG/ML IJ SOLN: 12.5000 mg | Freq: Four times a day (QID) | INTRAMUSCULAR | Status: DC | PRN

## 2021-09-23 MED ORDER — ROCURONIUM BROMIDE 100 MG/10ML IV SOLN
INTRAVENOUS | Status: DC | PRN
  Administered 2021-09-23 (×3): 10 mg via INTRAVENOUS
  Administered 2021-09-23: 60 mg via INTRAVENOUS

## 2021-09-23 MED ORDER — ZOLPIDEM TARTRATE 5 MG PO TABS: 5.0000 mg | ORAL_TABLET | Freq: Every evening | ORAL | Status: DC | PRN

## 2021-09-23 MED ORDER — FENTANYL CITRATE PF 50 MCG/ML IJ SOSY: 25.0000 ug | PREFILLED_SYRINGE | INTRAMUSCULAR | Status: DC | PRN

## 2021-09-23 MED ORDER — ONDANSETRON HCL 4 MG/2ML IJ SOLN
4.0000 mg | Freq: Once | INTRAMUSCULAR | Status: DC | PRN
Start: 2021-09-23 — End: 2021-09-23

## 2021-09-23 MED ORDER — AMISULPRIDE (ANTIEMETIC) 5 MG/2ML IV SOLN: 10.0000 mg | Freq: Once | INTRAVENOUS | Status: DC | PRN

## 2021-09-23 MED ORDER — OXYBUTYNIN CHLORIDE 5 MG PO TABS: 5.0000 mg | ORAL_TABLET | Freq: Three times a day (TID) | ORAL | Status: DC | PRN

## 2021-09-23 MED ORDER — OXYCODONE HCL 5 MG PO TABS: 5.0000 mg | ORAL_TABLET | ORAL | Status: DC | PRN

## 2021-09-23 MED ORDER — CEFAZOLIN SODIUM-DEXTROSE 2-4 GM/100ML-% IV SOLN
2.0000 g | Freq: Three times a day (TID) | INTRAVENOUS | Status: AC
  Administered 2021-09-23 – 2021-09-24 (×2): 2 g via INTRAVENOUS
  Filled 2021-09-23 (×2): qty 100

## 2021-09-23 MED ORDER — PHENYLEPHRINE HCL (PRESSORS) 10 MG/ML IV SOLN
INTRAVENOUS | Status: DC | PRN
  Administered 2021-09-23: 80 ug via INTRAVENOUS

## 2021-09-23 MED ORDER — LISINOPRIL 20 MG PO TABS
20.0000 mg | ORAL_TABLET | Freq: Every day | ORAL | Status: DC
  Administered 2021-09-23: 20 mg via ORAL
  Filled 2021-09-23: qty 1

## 2021-09-23 MED ORDER — CEFAZOLIN SODIUM-DEXTROSE 2-3 GM-%(50ML) IV SOLR
INTRAVENOUS | Status: DC | PRN
  Administered 2021-09-23: 2 g via INTRAVENOUS

## 2021-09-23 MED ORDER — DIPHENHYDRAMINE HCL 12.5 MG/5ML PO ELIX: 12.5000 mg | ORAL_SOLUTION | Freq: Four times a day (QID) | ORAL | Status: DC | PRN

## 2021-09-23 MED ORDER — ACETAMINOPHEN 500 MG PO TABS: 1000.0000 mg | ORAL_TABLET | Freq: Once | ORAL | Status: DC

## 2021-09-23 SURGICAL SUPPLY — 24 items
BAG URINE DRAIN 2000ML AR STRL (UROLOGICAL SUPPLIES) ×3 IMPLANT
BAG URO CATCHER STRL LF (MISCELLANEOUS) ×3 IMPLANT
CATH FOLEY 2WAY SLVR  5CC 18FR (CATHETERS)
CATH FOLEY 2WAY SLVR 5CC 18FR (CATHETERS) IMPLANT
CATH FOLEY 3WAY 30CC 24FR (CATHETERS) ×3
CATH URTH STD 24FR FL 3W 2 (CATHETERS) ×2 IMPLANT
CLOTH BEACON ORANGE TIMEOUT ST (SAFETY) ×3 IMPLANT
DRAPE FOOT SWITCH (DRAPES) ×3 IMPLANT
ELECT REM PT RETURN 15FT ADLT (MISCELLANEOUS) ×3 IMPLANT
GLOVE BIO SURGEON STRL SZ7.5 (GLOVE) ×3 IMPLANT
GOWN STRL REUS W/ TWL XL LVL3 (GOWN DISPOSABLE) ×2 IMPLANT
GOWN STRL REUS W/TWL XL LVL3 (GOWN DISPOSABLE) ×3
HOLDER FOLEY CATH W/STRAP (MISCELLANEOUS) ×3 IMPLANT
KIT TURNOVER KIT A (KITS) ×1 IMPLANT
LOOP CUT BIPOLAR 24F LRG (ELECTROSURGICAL) ×3 IMPLANT
MANIFOLD NEPTUNE II (INSTRUMENTS) ×3 IMPLANT
PACK CYSTO (CUSTOM PROCEDURE TRAY) ×3 IMPLANT
PENCIL SMOKE EVACUATOR (MISCELLANEOUS) IMPLANT
PLUG CATH AND CAP STER (CATHETERS) IMPLANT
SYR 30ML LL (SYRINGE) ×3 IMPLANT
SYR TOOMEY IRRIG 70ML (MISCELLANEOUS) ×3
SYRINGE TOOMEY IRRIG 70ML (MISCELLANEOUS) ×2 IMPLANT
TUBING CONNECTING 10 (TUBING) ×3 IMPLANT
TUBING UROLOGY SET (TUBING) ×3 IMPLANT

## 2021-09-23 NOTE — Anesthesia Preprocedure Evaluation (Signed)
Anesthesia Evaluation  Patient identified by MRN, date of birth, ID band Patient awake    Reviewed: Allergy & Precautions, NPO status , Patient's Chart, lab work & pertinent test results  History of Anesthesia Complications Negative for: history of anesthetic complications  Airway Mallampati: II  TM Distance: >3 FB Neck ROM: Full    Dental  (+) Dental Advisory Given   Pulmonary neg pulmonary ROS,    Pulmonary exam normal        Cardiovascular hypertension, Normal cardiovascular exam     Neuro/Psych negative neurological ROS     GI/Hepatic negative GI ROS, Neg liver ROS,   Endo/Other  negative endocrine ROS  Renal/GU negative Renal ROS   Bladder tumor    Musculoskeletal negative musculoskeletal ROS (+)   Abdominal   Peds  Hematology  (+) Blood dyscrasia, anemia ,   Anesthesia Other Findings   Reproductive/Obstetrics                             Anesthesia Physical Anesthesia Plan  ASA: 2  Anesthesia Plan: General   Post-op Pain Management: Tylenol PO (pre-op)* and Toradol IV (intra-op)*   Induction: Intravenous  PONV Risk Score and Plan: 2 and Ondansetron, Dexamethasone, Midazolam and Treatment may vary due to age or medical condition  Airway Management Planned: LMA  Additional Equipment: None  Intra-op Plan:   Post-operative Plan: Extubation in OR  Informed Consent: I have reviewed the patients History and Physical, chart, labs and discussed the procedure including the risks, benefits and alternatives for the proposed anesthesia with the patient or authorized representative who has indicated his/her understanding and acceptance.     Dental advisory given  Plan Discussed with:   Anesthesia Plan Comments:         Anesthesia Quick Evaluation

## 2021-09-23 NOTE — Anesthesia Postprocedure Evaluation (Signed)
Anesthesia Post Note  Patient: Terrence Rivera  Procedure(s) Performed: CYSTOSCOPY TRANSURETHRAL RESECTION OF BLADDER TUMOR (TURBT) (Bilateral) TRANSURETHRAL RESECTION OF THE PROSTATE (TURP)     Patient location during evaluation: PACU Anesthesia Type: General Level of consciousness: awake and alert, oriented and patient cooperative Pain management: pain level controlled Vital Signs Assessment: post-procedure vital signs reviewed and stable Respiratory status: spontaneous breathing, nonlabored ventilation and respiratory function stable Cardiovascular status: blood pressure returned to baseline and stable Postop Assessment: no apparent nausea or vomiting Anesthetic complications: no   No notable events documented.  Last Vitals:  Vitals:   09/23/21 1445 09/23/21 1500  BP: (!) 155/72 (!) 162/74  Pulse: 65 66  Resp: 16 18  Temp: (!) 36.4 C   SpO2: 99% 100%    Last Pain:  Vitals:   09/23/21 1500  TempSrc:   PainSc: 0-No pain                 Pervis Hocking

## 2021-09-23 NOTE — H&P (Signed)
CC/HPI: CC: Gross hematuria  HPI:  06/04/2021  81 year old male with a month-long history of intermittent gross hematuria. May be occurring after intercourse. Somewhat poor historian. States that the blood is not in his semen. The blood is in his urine. He denies any associated voiding complaints. He does mention that he had some dysuria and was prescribed some antibiotics. However, continues to have the hematuria but no longer having any dysuria or voiding complaints.   07/30/2021  Patient underwent a CT hematuria protocol. This revealed multiple enhancing bladder masses. 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 Pulmonary referral was recommended. In the abdomen he had no lymphadenopathy or concerns for metastatic disease.   09/17/2021  Patient underwent cystoscopy at the last visit which revealed the following:   There was some impaired visualization in the bladder. However, there was tumor throughout the bladder including larger papillary tumor on the right lateral wall towards the bladder neck as well. There was a lot of tumor throughout the bladder.   We have not been able to get in touch with him. I sent him a letter. Fortunately, he comes in today and we can get him set up for surgery with my surgery scheduler here to discuss it with him. We again went over the risk and benefits. Translator used to set him up for surgery.     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 Drinks 2 caffeinated drinks per day. Has not had a blood transfusion.    REVIEW OF SYSTEMS:    GU Review Male:   Patient denies frequent urination, hard to postpone urination, burning/ pain with urination, get up at night to urinate, leakage of urine, stream starts and stops, trouble starting your stream, have to strain to urinate , erection problems, and penile pain.  Gastrointestinal (Upper):   Patient denies nausea, vomiting, and indigestion/ heartburn.  Gastrointestinal (Lower):   Patient denies diarrhea and constipation.  Constitutional:   Patient denies fever, night sweats, weight loss, and fatigue.  Skin:   Patient denies skin rash/ lesion and itching.  Eyes:   Patient denies blurred vision and double vision.  Ears/ Nose/ Throat:   Patient denies sore throat and sinus problems.  Hematologic/Lymphatic:   Patient denies swollen glands and easy bruising.  Cardiovascular:   Patient denies leg swelling and chest pains.  Respiratory:   Patient denies cough and shortness of breath.  Endocrine:   Patient denies excessive thirst.  Musculoskeletal:   Patient denies back pain and joint pain.  Neurological:   Patient denies headaches and dizziness.  Psychologic:   Patient denies depression and anxiety.   VITAL SIGNS: None   MULTI-SYSTEM PHYSICAL EXAMINATION:    Gastrointestinal: No mass, no tenderness, no rigidity, non obese abdomen.     Complexity of Data:  Source Of History:  Patient  Records Review:   Previous Doctor Records, Previous Patient Records  Urine Test Review:  Urinalysis   06/30/21  PSA  Total PSA 1.07 ng/mL    PROCEDURES:          Urinalysis w/Scope Dipstick Dipstick Cont'd Micro  Color: Red Bilirubin: Neg mg/dL WBC/hpf: 10 - 20/hpf  Appearance: Cloudy Ketones: Neg mg/dL RBC/hpf: >60/hpf  Specific Gravity: 1.025 Blood: 3+ ery/uL Bacteria: Mod (26-50/hpf)  pH: 6.0 Protein: 1+ mg/dL  Cystals: NS (Not Seen)  Glucose: Neg mg/dL Urobilinogen: 0.2 mg/dL Casts: NS (Not Seen)    Nitrites: Neg Trichomonas: Not Present    Leukocyte Esterase: Trace leu/uL Mucous: Not Present      Epithelial Cells: 0 - 5/hpf      Yeast: NS (Not Seen)      Sperm: Not Present    ASSESSMENT:      ICD-10 Details  1 GU:   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 Risk and benefits again discussed. We will set him up for surgery with a interpreter. The patient speaks some English but we used the interpreter as well to make sure there was full understanding.   CC: Dr. Royce Macadamia    Signed by Link Snuffer, III, M.D. on 09/17/21 at 1:55 PM (EDT

## 2021-09-23 NOTE — Transfer of Care (Signed)
Immediate Anesthesia Transfer of Care Note  Patient: Terrence Rivera  Procedure(s) Performed: CYSTOSCOPY TRANSURETHRAL RESECTION OF BLADDER TUMOR (TURBT) (Bilateral) TRANSURETHRAL RESECTION OF THE PROSTATE (TURP)  Patient Location: PACU  Anesthesia Type:General  Level of Consciousness: drowsy and patient cooperative  Airway & Oxygen Therapy: Patient Spontanous Breathing  Post-op Assessment: Report given to RN and Post -op Vital signs reviewed and stable  Post vital signs: Reviewed and stable  Last Vitals:  Vitals Value Taken Time  BP 185/108 09/23/21 1415  Temp    Pulse 79 09/23/21 1416  Resp 17 09/23/21 1416  SpO2 99 % 09/23/21 1416  Vitals shown include unvalidated device data.  Last Pain:  Vitals:   09/23/21 1003  TempSrc:   PainSc: 0-No pain         Complications: No notable events documented.

## 2021-09-23 NOTE — Anesthesia Procedure Notes (Signed)
Procedure Name: Intubation Date/Time: 09/23/2021 12:44 PM  Performed by: Gwyndolyn Saxon, CRNAPre-anesthesia Checklist: Emergency Drugs available, Suction available, Patient identified and Patient being monitored Patient Re-evaluated:Patient Re-evaluated prior to induction Oxygen Delivery Method: Circle system utilized Preoxygenation: Pre-oxygenation with 100% oxygen Induction Type: IV induction Ventilation: Mask ventilation without difficulty Laryngoscope Size: Miller and 2 Grade View: Grade II Tube type: Oral Tube size: 7.5 mm Number of attempts: 1 Airway Equipment and Method: Patient positioned with wedge pillow and Stylet Placement Confirmation: ETT inserted through vocal cords under direct vision, positive ETCO2 and breath sounds checked- equal and bilateral Secured at: 23 cm Tube secured with: Tape Dental Injury: Teeth and Oropharynx as per pre-operative assessment

## 2021-09-23 NOTE — Op Note (Signed)
Operative Note  Preoperative diagnosis:  1.  Bladder tumor 2.  Prostatic urethral tumor  Postoperative diagnosis: 1.  Bladder tumor--large 2.  Prostatic urethral tumor  Procedure(s): 1.  Urethral dilation 2.  Transurethral resection of bladder tumor--large  Surgeon: Link Snuffer, MD  Assistants: None  Anesthesia: General  Complications: None immediate  EBL: Unable to quantify  Specimens: 1.  Bladder tumor  Drains/Catheters: 1.  24 French three-way Foley catheter with light CBI started  Intraoperative findings: 1.  Normal anterior urethra 2.  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  I opted not to resect this given the extensive resection in the bladder and the likely muscle invasive tumor that will require cystectomy with urethral ectomy. 3.  Unable to localize bilateral ureteral orifice.  There was a large volume of tumor throughout the bladder extending along the posterior wall along the lateral walls bilaterally.  I resected in the area of the ureteral orifices and did have to fulgurate to control bleeding.  There is bladder tumor on the anterior bladder wall and towards the dome as well that I did not resect.  Some of the anterior bladder wall tumor was resected but much of it was left.  There was a fair amount of bladder tumor on the left anterior lateral wall as well that was not resected.  Large amount of bladder tumor resected down to muscle fibers on the posterior bladder wall and right lateral wall especially.  No evidence of perforation but the bladder mucosa was thin in several areas.  Bladder without any evidence of perforation with a full bladder.  Indication: 81 year old male with gross hematuria found to have large volume of bladder and prostatic urethral tumor.  He presents for the previously mentioned operation.  Description of procedure:  The  patient was identified and consent was obtained.  The patient was taken to the operating room and placed in the supine position.  The patient was placed under general anesthesia.  Perioperative antibiotics were administered.  The patient was placed in dorsal lithotomy.  Patient was prepped and draped in a standard sterile fashion and a timeout was performed.  The meatus was too small for a resectoscope to insert.  Therefore, I dilated with sounds gently from 20 [place] up to 30 [place].  A 26 French resectoscope with visual obturator in place was advanced into the urethra and into the bladder.  This was exchanged for the working element.  I evacuated clots remaining in the bladder.  I performed a complete cystoscopy with findings noted above.  Specifically, there was a large volume of bladder tumor throughout the bladder.  He also had urothelial tumor involvement of the prostatic urethra.  This extended down to the level of the verumontanum.  I proceeded to resect the bladder tumor, a large amount of resection was performed posteriorly and laterally bilaterally.  I resected down the muscle fibers in multiple areas.  Fulgurated areas of bleeding.  There was a large amount of tumor remaining despite large amount of resection.  I collected all the specimen.  I then obtained hemostasis.  Once there was adequate hemostasis, I terminated the procedure.  I placed 24 French three-way Foley catheter and started a light drip CBI.  This include the operation.  Patient tolerated the procedure well was stable postoperative.  Plan: Obtain BMP after the surgery and again in the morning to ensure renal function remained stable.  Renal ultrasound if there is a bump in creatinine.  I did have to resect in the general location of the ureteral orifice bilaterally and unclear if these were affected in the process.  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  He will most likely need a cystoprostatectomy with  urethrectomy.  We will observe him overnight with light drip CBI.

## 2021-09-24 ENCOUNTER — Encounter (HOSPITAL_COMMUNITY): Payer: Self-pay | Admitting: Urology

## 2021-09-24 DIAGNOSIS — C679 Malignant neoplasm of bladder, unspecified: Secondary | ICD-10-CM | POA: Diagnosis not present

## 2021-09-24 LAB — CBC
HCT: 34.5 % — ABNORMAL LOW (ref 39.0–52.0)
Hemoglobin: 10.9 g/dL — ABNORMAL LOW (ref 13.0–17.0)
MCH: 27.3 pg (ref 26.0–34.0)
MCHC: 31.6 g/dL (ref 30.0–36.0)
MCV: 86.3 fL (ref 80.0–100.0)
Platelets: 217 10*3/uL (ref 150–400)
RBC: 4 MIL/uL — ABNORMAL LOW (ref 4.22–5.81)
RDW: 16.1 % — ABNORMAL HIGH (ref 11.5–15.5)
WBC: 11.1 10*3/uL — ABNORMAL HIGH (ref 4.0–10.5)
nRBC: 0 % (ref 0.0–0.2)

## 2021-09-24 LAB — SURGICAL PATHOLOGY

## 2021-09-24 MED ORDER — OXYCODONE HCL 5 MG PO TABS: 5.0000 mg | ORAL_TABLET | ORAL | 0 refills | Status: DC | PRN

## 2021-09-24 NOTE — TOC Progression Note (Signed)
Transition of Care [facility]) - Progression Note    Patient Details  Name: Terrence Rivera MRN: [id number] Date of Birth: [birth year]  Transition of Care [facility]) CM/SW Contact  Joaquin Courts, RN Phone Number: 09/24/2021, 9:05 AM  Clinical Narrative:         Transition of Care Department [facility]) has reviewed patient and no TOC needs have been identified at this time. We will continue to monitor patient advancement through interdisciplinary progression rounds. If new patient transition needs arise, please place a TOC consult.

## 2021-09-24 NOTE — Plan of Care (Signed)
With use of spanish interpreter educated patient  and his wife on how to use leg bag, how to switch back and forth between bedside bag and leg bag.  Also discussed the importance of notifying the urologist or returning to ED if urine stops draining from foley.  Patient verbalized understanding of all of this.  Discharge instructions given to patient in [place] and [place], also went over these utilizing [place] interpreter.  Patient transported via wheelchair to main entrance to be taken home by granddaughter.

## 2021-09-24 NOTE — Discharge Instructions (Signed)

## 2021-09-24 NOTE — Discharge Summary (Signed)
Physician Discharge Summary  Patient ID: Terrence Rivera MRN: [id number] DOB/AGE: 1940-03-13 81 y.o.  Admit date: 09/23/2021 Discharge date: 09/24/2021  Admission Diagnoses:  Discharge Diagnoses:  Principal Problem:   Bladder tumor   Discharged Condition: good  Hospital Course: Patient underwent a TURBT-status large.  Tumor incompletely resected.  Large amount of resection.  Remained overnight on observation with large catheter and CBI.  CBI was weaned off the following day.  Urine did have some intermittent blood as anticipated.  However, hemoglobin stable and patient without complaint.  Consults: None  Significant Diagnostic Studies: None  Treatments: surgery: As above  Discharge Exam: Blood pressure (!) 109/56, pulse 80, temperature 98.3 F (36.8 C), temperature source Oral, resp. rate 18, height '5\' 8"'$  (1.727 m), weight 69.4 kg, SpO2 98 %. General appearance: alert no acute distress Abdomen soft, nontender, nondistended Three-way Foley catheter in place with light red urine.  Flush clear quickly with the CBI.  Turned it off and no significant hematuria after turning it off.  Disposition: Discharge disposition: 01-Home or Self Care       Discharge Instructions     Diet - low sodium heart healthy   Complete by: As directed       Allergies as of 09/24/2021   No Known Allergies      Medication List     TAKE these medications    lisinopril 20 MG tablet Commonly known as: ZESTRIL Take 1 tablet (20 mg total) by mouth daily.   oxyCODONE 5 MG immediate release tablet Commonly known as: Oxy IR/ROXICODONE Take 1 tablet (5 mg total) by mouth every 4 (four) hours as needed for moderate pain.         Signed: Marton Redwood, III 09/24/2021, 7:56 AM

## 2021-09-24 NOTE — Care Management Obs Status (Signed)
[place] NOTIFICATION   Patient Details  Name: Terrence Rivera MRN: [id number] Date of Birth: 05/05/1940   Medicare Observation Status Notification Given:  Yes    Joaquin Courts, RN 09/24/2021, 9:05 AM

## 2021-10-12 ENCOUNTER — Telehealth: Payer: Self-pay | Admitting: Oncology

## 2021-10-12 NOTE — Telephone Encounter (Signed)
Scheduled appt per 8/4 referral. Pt is aware of appt date and time. Pt is aware to arrive 15 mins prior to appt time and to bring and updated insurance card. Pt is aware of appt location.

## 2021-10-30 ENCOUNTER — Inpatient Hospital Stay: Payer: Medicare (Managed Care) | Attending: Oncology | Admitting: Oncology

## 2021-10-30 ENCOUNTER — Other Ambulatory Visit: Payer: Self-pay

## 2021-10-30 ENCOUNTER — Telehealth: Payer: Self-pay | Admitting: Oncology

## 2021-10-30 VITALS — BP 146/71 | HR 79 | Temp 97.9°F | Resp 18 | Ht 68.0 in | Wt 147.5 lb

## 2021-10-30 DIAGNOSIS — C679 Malignant neoplasm of bladder, unspecified: Secondary | ICD-10-CM | POA: Insufficient documentation

## 2021-10-30 DIAGNOSIS — I10 Essential (primary) hypertension: Secondary | ICD-10-CM | POA: Diagnosis not present

## 2021-10-30 MED ORDER — PROCHLORPERAZINE MALEATE 10 MG PO TABS: 10.0000 mg | ORAL_TABLET | Freq: Four times a day (QID) | ORAL | 0 refills | Status: AC | PRN

## 2021-10-30 MED ORDER — LIDOCAINE-PRILOCAINE 2.5-2.5 % EX CREA: 1.0000 | TOPICAL_CREAM | CUTANEOUS | 0 refills | Status: DC | PRN

## 2021-10-30 NOTE — Telephone Encounter (Signed)
Scheduled per 08/25 los, patient has been called and notified of upcoming appointments.

## 2021-10-30 NOTE — Progress Notes (Signed)
START ON PATHWAY REGIMEN - Bladder     A cycle is every 21 days:     Gemcitabine      Cisplatin   **Always confirm dose/schedule in your pharmacy ordering system**  Patient Characteristics: Pre-Cystectomy or Nonsurgical Candidate (Clinical Staging), cT2-4a, cN0-1, M0, Cystectomy Eligible, Cisplatin-Based Chemotherapy Indicated with CrCl ? 60 mL/min and Minimal or No Symptoms Therapeutic Status: Pre-Cystectomy or Nonsurgical Candidate (Clinical Staging) AJCC M Category: cM0 AJCC 8 Stage Grouping: IIIA AJCC T Category: cT4a AJCC N Category: cN0 Intent of Therapy: Curative Intent, Discussed with Patient

## 2021-10-30 NOTE — Progress Notes (Signed)
Reason for the request:    Bladder cancer  HPI: I was asked by Dr. Gloriann Loan to evaluate Terrence Rivera for the diagnosis of bladder cancer.  He is a 81 year old man with history of hypertension who presented with the symptoms of hematuria dating back in March 2023.  He underwent CT scan for hematuria protocol and revealed multiple enhancing bladder masses Jul 30, 2021.  Based on these findings he underwent cystoscopy and TURBT on September 23, 2021 which showed a large tumor involving the prostatic urethra with large volume disease around the ureteral orifices.  The tumor was not completely resected.  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  He was sent to me for evaluation for possible neoadjuvant chemotherapy and currently under evaluation for possible cystectomy.  Clinically, he reports feeling well without any complaints.  He continues to work part-time and remains active.  He denies any hematuria, dysuria or bone pain.   He does not report any headaches, blurry vision, syncope or seizures. Does not report any fevers, chills or sweats.  Does not report any cough, wheezing or hemoptysis.  Does not report any chest pain, palpitation, orthopnea or leg edema.  Does not report any nausea, vomiting or abdominal pain.  Does not report any constipation or diarrhea.  Does not report any skeletal complaints.    Does not report frequency, urgency or hematuria.  Does not report any skin rashes or lesions. Does not report any heat or cold intolerance.  Does not report any lymphadenopathy or petechiae.  Does not report any anxiety or depression.  Remaining review of systems is negative.     Past Medical History:  Diagnosis Date   Hypertension   :   Past Surgical History:  Procedure Laterality Date   CYSTOSCOPY Bilateral 09/23/2021   Procedure: CYSTOSCOPY TRANSURETHRAL RESECTION OF BLADDER TUMOR (TURBT);  Surgeon: Lucas Mallow, MD;   Location: [facility];  Service: Urology;  Laterality: Bilateral;   TRANSURETHRAL RESECTION OF PROSTATE N/A 09/23/2021   Procedure: TRANSURETHRAL RESECTION OF THE PROSTATE (TURP);  Surgeon: Lucas Mallow, MD;  Location: [facility];  Service: Urology;  Laterality: N/A;  1 HR FOR CASE  :   Current Outpatient Medications:    lisinopril (ZESTRIL) 20 MG tablet, Take 1 tablet (20 mg total) by mouth daily., Disp: 60 tablet, Rfl: 0   oxyCODONE (OXY IR/ROXICODONE) 5 MG immediate release tablet, Take 1 tablet (5 mg total) by mouth every 4 (four) hours as needed for moderate pain., Disp: 30 tablet, Rfl: 0:  No Known Allergies:  No family history on file.:   Social History   Socioeconomic History   Marital status: Married    Spouse name: Not on file   Number of children: Not on file   Years of education: Not on file   Highest education level: Not on file  Occupational History   Not on file  Tobacco Use   Smoking status: Never   Smokeless tobacco: Never  Vaping Use   Vaping Use: Never used  Substance and Sexual Activity   Alcohol use: Yes    Comment: occ/rare   Drug use: Not on file   Sexual activity: Not on file  Other Topics Concern   Not on file  Social History Narrative   Not on file   Social Determinants of Health   Financial Resource Strain: Not on file  Food Insecurity: Not on file  Transportation Needs: Not on file  Physical Activity:  Not on file  Stress: Not on file  Social Connections: Not on file  Intimate Partner Violence: Not on file  :  Pertinent items are noted in HPI.  Exam: Blood pressure (!) 146/71, pulse 79, temperature 97.9 F (36.6 C), temperature source Temporal, resp. rate 18, height '5\' 8"'$  (1.727 m), weight 147 lb 8 oz (66.9 kg), SpO2 99 %. ECOG 1 General appearance: alert and cooperative appeared without distress. Head: atraumatic without any abnormalities. Eyes: conjunctivae/corneas clear. PERRL.  Sclera anicteric. Throat: lips, mucosa, and tongue  normal; without oral thrush or ulcers. Resp: clear to auscultation bilaterally without rhonchi, wheezes or dullness to percussion. Cardio: regular rate and rhythm, S1, S2 normal, no murmur, click, rub or gallop GI: soft, non-tender; bowel sounds normal; no masses,  no organomegaly Skin: Skin color, texture, turgor normal. No rashes or lesions Lymph nodes: Cervical, supraclavicular, and axillary nodes normal. Neurologic: Grossly normal without any motor, sensory or deep tendon reflexes. Musculoskeletal: No joint deformity or effusion.   Assessment and Plan:   81 year old with:   1.  Bladder cancer presented with hematuria in March 2023 and subsequently developed multifocal disease involving the prostatic urethra with high-grade urothelial carcinoma and muscle invasion.  No evidence of metastatic disease based on imaging studies in May 2023.  The natural course of this disease was reviewed at this time and treatment choices were discussed.  Radical cystectomy after neoadjuvant chemotherapy remains his best curative option if he does not have widely metastatic disease.  Updating his staging scan will be necessary at this time to rule out metastatic disease.   The logistics and rationale for using chemotherapy was reviewed today in detail.  Complication associated with cisplatin and gemcitabine chemotherapy was discussed.  These complications include nausea, vomiting, myelosuppression, fatigue, infusion related complications, renal insufficiency, neutropenia, neutropenic sepsis and rarely serious thrombosis, hospitalization and death.  The benefit would also if he has an excellent response to chemotherapy, curative surgical resection may be attempted.  The plan is to treat with gemcitabine and cisplatin on day 1, gemcitabine day 8 out of a 21-day cycle.  Anticipate needing 4-6 cycles of therapy    After discussion today, he is agreeable to proceed after chemo education class.     2.  IV access:  Risks and benefits of using Port-A-Cath versus peripheral veins was discussed today.  Complication associated with Port-A-Cath insertion include bleeding, infection and thrombosis.  After discussing the risks and benefits, he is agreeable to proceed.   3.  Antiemetics: Prescription for Compazine was made available to him.   4.  Renal function surveillance: His baseline kidney function is normal continue to monitor on platinum therapy.   5.  Goals of care: Obese determined pending his staging scans.  He has no evidence of metastatic disease and the treatment would be curative at this time.   6.  Follow-up: will be in the immediate future to start chemotherapy.  60  minutes were dedicated to this visit. The time was spent on reviewing laboratory data, imaging studies, discussing treatment options,  and answering questions regarding future plan.     A copy of this consult has been forwarded to the requesting physician.

## 2021-10-31 ENCOUNTER — Other Ambulatory Visit: Payer: Self-pay

## 2021-11-06 ENCOUNTER — Other Ambulatory Visit: Payer: Self-pay

## 2021-11-06 ENCOUNTER — Inpatient Hospital Stay: Payer: Medicare (Managed Care) | Attending: Oncology

## 2021-11-06 DIAGNOSIS — Z5111 Encounter for antineoplastic chemotherapy: Secondary | ICD-10-CM | POA: Insufficient documentation

## 2021-11-06 DIAGNOSIS — C679 Malignant neoplasm of bladder, unspecified: Secondary | ICD-10-CM | POA: Insufficient documentation

## 2021-11-12 ENCOUNTER — Other Ambulatory Visit: Payer: Self-pay

## 2021-11-13 ENCOUNTER — Inpatient Hospital Stay: Payer: Medicare (Managed Care)

## 2021-11-13 ENCOUNTER — Encounter: Payer: Self-pay | Admitting: Oncology

## 2021-11-13 ENCOUNTER — Other Ambulatory Visit: Payer: Self-pay

## 2021-11-13 VITALS — BP 161/74 | HR 86 | Temp 98.7°F | Resp 18 | Wt 151.1 lb

## 2021-11-13 DIAGNOSIS — Z5111 Encounter for antineoplastic chemotherapy: Secondary | ICD-10-CM | POA: Diagnosis present

## 2021-11-13 DIAGNOSIS — C679 Malignant neoplasm of bladder, unspecified: Secondary | ICD-10-CM

## 2021-11-13 LAB — CMP (CANCER CENTER ONLY)
ALT: 47 U/L — ABNORMAL HIGH (ref 0–44)
AST: 30 U/L (ref 15–41)
Albumin: 3.1 g/dL — ABNORMAL LOW (ref 3.5–5.0)
Alkaline Phosphatase: 87 U/L (ref 38–126)
Anion gap: 5 (ref 5–15)
BUN: 28 mg/dL — ABNORMAL HIGH (ref 8–23)
CO2: 27 mmol/L (ref 22–32)
Calcium: 8.6 mg/dL — ABNORMAL LOW (ref 8.9–10.3)
Chloride: 101 mmol/L (ref 98–111)
Creatinine: 1.24 mg/dL (ref 0.61–1.24)
GFR, Estimated: 59 mL/min — ABNORMAL LOW (ref >60)
Glucose, Bld: 180 mg/dL — ABNORMAL HIGH (ref 70–99)
Potassium: 3.9 mmol/L (ref 3.5–5.1)
Sodium: 133 mmol/L — ABNORMAL LOW (ref 135–145)
Total Bilirubin: 0.4 mg/dL (ref 0.3–1.2)
Total Protein: 7.4 g/dL (ref 6.5–8.1)

## 2021-11-13 LAB — CBC WITH DIFFERENTIAL (CANCER CENTER ONLY)
Abs Immature Granulocytes: 0.08 10*3/uL — ABNORMAL HIGH (ref 0.00–0.07)
Basophils Absolute: 0.1 10*3/uL (ref 0.0–0.1)
Basophils Relative: 1 %
Eosinophils Absolute: 0.3 10*3/uL (ref 0.0–0.5)
Eosinophils Relative: 2 %
HCT: 28.4 % — ABNORMAL LOW (ref 39.0–52.0)
Hemoglobin: 9 g/dL — ABNORMAL LOW (ref 13.0–17.0)
Immature Granulocytes: 1 %
Lymphocytes Relative: 18 %
Lymphs Abs: 1.9 10*3/uL (ref 0.7–4.0)
MCH: 26.4 pg (ref 26.0–34.0)
MCHC: 31.7 g/dL (ref 30.0–36.0)
MCV: 83.3 fL (ref 80.0–100.0)
Monocytes Absolute: 0.6 10*3/uL (ref 0.1–1.0)
Monocytes Relative: 6 %
Neutro Abs: 7.8 10*3/uL — ABNORMAL HIGH (ref 1.7–7.7)
Neutrophils Relative %: 72 %
Platelet Count: 371 10*3/uL (ref 150–400)
RBC: 3.41 MIL/uL — ABNORMAL LOW (ref 4.22–5.81)
RDW: 16.1 % — ABNORMAL HIGH (ref 11.5–15.5)
WBC Count: 10.8 10*3/uL — ABNORMAL HIGH (ref 4.0–10.5)
nRBC: 0 % (ref 0.0–0.2)

## 2021-11-13 LAB — MAGNESIUM: Magnesium: 1.9 mg/dL (ref 1.7–2.4)

## 2021-11-13 MED ORDER — SODIUM CHLORIDE 0.9 % IV SOLN
50.0000 mg/m2 | Freq: Once | INTRAVENOUS | Status: AC
  Administered 2021-11-13: 90 mg via INTRAVENOUS
  Filled 2021-11-13: qty 90

## 2021-11-13 MED ORDER — PALONOSETRON HCL INJECTION 0.25 MG/5ML
0.2500 mg | Freq: Once | INTRAVENOUS | Status: AC
  Administered 2021-11-13: 0.25 mg via INTRAVENOUS
  Filled 2021-11-13: qty 5

## 2021-11-13 MED ORDER — SODIUM CHLORIDE 0.9 % IV SOLN: Freq: Once | INTRAVENOUS | Status: AC

## 2021-11-13 MED ORDER — MAGNESIUM SULFATE 2 GM/50ML IV SOLN
2.0000 g | Freq: Once | INTRAVENOUS | Status: AC
  Administered 2021-11-13: 2 g via INTRAVENOUS
  Filled 2021-11-13: qty 50

## 2021-11-13 MED ORDER — POTASSIUM CHLORIDE IN NACL 20-0.9 MEQ/L-% IV SOLN
Freq: Once | INTRAVENOUS | Status: AC
  Filled 2021-11-13: qty 1000

## 2021-11-13 MED ORDER — SODIUM CHLORIDE 0.9 % IV SOLN
800.0000 mg/m2 | Freq: Once | INTRAVENOUS | Status: AC
  Administered 2021-11-13: 1444 mg via INTRAVENOUS
  Filled 2021-11-13: qty 37.98

## 2021-11-13 MED ORDER — SODIUM CHLORIDE 0.9 % IV SOLN
150.0000 mg | Freq: Once | INTRAVENOUS | Status: AC
  Administered 2021-11-13: 150 mg via INTRAVENOUS
  Filled 2021-11-13: qty 150

## 2021-11-13 MED ORDER — SODIUM CHLORIDE 0.9 % IV SOLN
10.0000 mg | Freq: Once | INTRAVENOUS | Status: AC
  Administered 2021-11-13: 10 mg via INTRAVENOUS
  Filled 2021-11-13: qty 10

## 2021-11-13 NOTE — Patient Instructions (Signed)
Instrucciones al darle de alta: Discharge Instructions Gracias por elegir al [facility] de Cncer de [place] para brindarle atencin mdica de oncologa y Music therapist.   Si usted tiene una cita de laboratorio con [place], por favor vaya directamente [place] y regstrese en el rea de Control and instrumentation engineer.   Use ropa cmoda y [place] para tener fcil acceso a las vas del Portacath (acceso venoso de Engineer, site duracin) o la lnea PICC (catter central colocado por va perifrica).   Nos esforzamos por ofrecerle tiempo de calidad con su proveedor. Es posible que tenga que volver a programar su cita si llega tarde (15 minutos o ms).  El llegar tarde le afecta a usted y a otros pacientes cuyas citas son posteriores a Merchandiser, retail.  Adems, si usted falta a tres o ms citas sin avisar a la oficina, puede ser retirado(a) de la clnica a discrecin del proveedor.      Para las solicitudes de renovacin de recetas, pida a su farmacia que se ponga en contacto con nuestra oficina y deje que transcurran 68 horas para que se complete el proceso de las renovaciones.    Hoy usted recibi los siguientes agentes de quimioterapia e/o inmunoterapia: Gemcitabine, Cisplatin.       Para ayudar a prevenir las nuseas y los vmitos despus de su tratamiento, le recomendamos que tome su medicamento para las nuseas segn las indicaciones.  LOS SNTOMAS QUE DEBEN COMUNICARSE INMEDIATAMENTE SE INDICAN A CONTINUACIN: *FIEBRE SUPERIOR A 100.4 F (38 C) O MS *ESCALOFROS O SUDORACIN *NUSEAS Y VMITOS QUE NO SE CONTROLAN CON EL MEDICAMENTO PARA LAS NUSEAS *DIFICULTAD INUSUAL PARA RESPIRAR  *MORETONES O HEMORRAGIAS NO HABITUALES *PROBLEMAS URINARIOS (dolor o ardor al Garment/textile technologist o frecuencia para Garment/textile technologist) *PROBLEMAS INTESTINALES (diarrea inusual, estreimiento, dolor cerca del ano) SENSIBILIDAD EN LA BOCA Y EN LA GARGANTA CON O SIN LA PRESENCIA DE LCERAS (dolor de garganta, llagas en la boca o dolor de  muelas/dientes) ERUPCIN, HINCHAZN O DOLORES INUSUALES FLUJO VAGINAL INUSUAL O PICAZN/RASQUIA    Los puntos marcados con un asterisco ( *) indican una posible emergencia y debe hacer un seguimiento tan pronto como le sea posible o vaya al Departamento de Emergencias si se le presenta algn problema.  Por favor, muestre la [place] DE ADVERTENCIA DE Windy Canny DE ADVERTENCIA DE Benay Spice al registrarse en [address] de Emergencias y a la enfermera de triaje.  Si tiene preguntas despus de su visita o necesita cancelar o volver a programar su cita, por favor pngase en contacto con [place]  Dept: [phone] y [place]. Las horas de oficina son de 8:00 a.m. a 4:30 p.m. de lunes a viernes. Por favor, tenga en cuenta que los mensajes de voz que se dejan despus de las 4:00 p.m. posiblemente no se devolvern hasta el siguiente da de [place].  Cerramos los fines de semana y [employer]. En todo momento tiene acceso a una enfermera para preguntas urgentes. Por favor, llame al nmero principal de la clnica Dept: [phone] y [place] instrucciones.   Para cualquier pregunta que no sea de carcter urgente, tambin puede ponerse en contacto con su proveedor [employer]. Ahora ofrecemos visitas electrnicas para cualquier persona mayor de 18 aos que solicite atencin mdica en lnea para los sntomas que no sean urgentes. Para ms detalles vaya a [email or web address].   Tambin puede bajar la aplicacin de MyChart! Vaya a la tienda de aplicaciones, busque "MyChart", abra la  aplicacin, seleccione [place], e ingrese con su nombre de usuario y la contrasea de Pharmacist, community.  Las mscaras son opcionales en los centros de Hotel manager. Si desea que su equipo de cuidados mdicos use una [employer] atienden, por favor hgaselo saber al personal. Bethann Berks[place] una persona de apoyo que tenga por lo menos 16 aos  para que le acompae a sus citas. Gemcitabine Injection Qu es este medicamento? La GEMCITABINA es un agente quimioteraputico. Este medicamento se [place] para tratar muchos tipos de cncer, tales como cncer de mama, cncer de pulmn, cncer de pncreas y cncer de ovario. Este medicamento puede ser utilizado para otros usos; si tiene alguna pregunta consulte con su proveedor de atencin mdica o con su farmacutico. MARCAS COMUNES: Gemzar, Infugem Qu le debo informar a mi profesional de la salud antes de tomar este medicamento? Necesitan saber si usted presenta alguno de los siguientes problemas o situaciones: trastornos sanguneos infeccin enfermedad renal enfermedad heptica enfermedad pulmonar o respiratoria, como asma terapia de radiacin en curso o reciente una reaccin alrgica o inusual a la gemcitabina, a otros medicamentos quimioteraputicos, a otros medicamentos, alimentos, colorantes o conservantes si est embarazada o buscando quedar embarazada si est amamantando a un beb Cmo debo utilizar este medicamento? Este medicamento se administra mediante infusin por va intravenosa. Lo administra un profesional de la salud calificado en un hospital o en un entorno clnico. Hable con su pediatra para informarse acerca del uso de este medicamento en nios. Puede requerir atencin especial. Sobredosis: Pngase en contacto inmediatamente con un centro toxicolgico o una sala de urgencia si usted cree que haya tomado demasiado medicamento. ATENCIN: [employer] es solo para usted. No comparta este medicamento con nadie. Qu sucede si me olvido de una dosis? Es importante no olvidar ninguna dosis. Informe a su mdico o a su profesional de la salud si no puede asistir a Photographer. Qu puede interactuar con este medicamento? medicamentos para incrementar los conteos sanguneos, tales como filgrastim, pegfilgrastim, sargramostim algunos otros agentes quimioteraputicos como  cisplatino vacunas Consulte a su mdico o a su profesional de la salud antes de tomar cualquiera de los siguientes medicamentos: acetaminofeno aspirina ibuprofeno quetoprofeno naproxeno Puede ser que esta lista no menciona todas las posibles interacciones. Informe a su profesional de KB Home	[place] de [employer] productos a base de hierbas, medicamentos de [place] o suplementos nutritivos que est tomando. Si usted fuma, consume bebidas alcohlicas o si utiliza drogas ilegales, indqueselo tambin a su profesional de KB Home	[place]. Algunas sustancias pueden interactuar con su medicamento. A qu debo estar atento al usar Coca[employer]Cola? Visite a su mdico para que revise su evolucin. Este medicamento podra hacerle sentir un Nurse, mental health. Esto es normal, ya que la quimioterapia puede afectar tanto a las clulas sanas como a las clulas cancerosas. Si presenta algn efecto secundario, infrmelo. Contine con el tratamiento aun si se siente enfermo, a menos que su mdico le indique que lo suspenda. En algunos casos, podra recibir [employer] para ayudarlo con los efectos secundarios. Siga todas las instrucciones para usarlos. Consulte a su mdico o a su profesional de la salud si tiene fiebre, escalofros o dolor de garganta, o cualquier otro sntoma de resfro o gripe. No se trate usted mismo. Este medicamento reduce la capacidad del cuerpo para combatir infecciones. Trate de no acercarse a personas que estn enfermas. Este medicamento podra aumentar el riesgo de moretones o sangrado. Consulte a su mdico o a su profesional de la salud si observa sangrados  inusuales. Proceda con cuidado al cepillar sus dientes, usar hilo dental o Risk manager palillos para los dientes, ya que podra contraer una infeccin o Therapist, art con mayor facilidad. Si se somete a algn tratamiento dental, informe a su dentista que est [employer]. Evite usar productos que contienen aspirina, acetaminofeno,  ibuprofeno, naproxeno o ketoprofeno, a menos que as lo indique su mdico. Estos productos pueden ocultar la fiebre. No debe quedar embarazada mientras est usando este medicamento o por 6 meses despus de dejar de usarlo. Las mujeres deben informar a su mdico si estn buscando quedar embarazadas o si creen que podran estar embarazadas. Los hombres no deben tener hijos mientras estn recibiendo Coca[employer]Cola y [place] 3 meses despus de dejar de usarlo. Existe la posibilidad de efectos secundarios graves en un beb sin nacer. Para obtener ms informacin, hable con su profesional de la salud o su farmacutico. No debe Economist a un beb mientras est tomando este medicamento o durante al menos 1 semana despus de dejar de usarlo. Los hombres deben informar a su mdico si quieren tener hijos. Este medicamento puede reducir el recuento de esperma. Hable con su mdico o su profesional de la salud si est preocupado por su fertilidad. Qu efectos secundarios puedo tener al [employer] este medicamento? Efectos secundarios que debe informar a su mdico o a Barrister's clerk de la salud tan pronto como sea posible: Chief of Staff, como erupcin cutnea, comezn/picazn o urticaria, e hinchazn de la cara, los labios o la lengua problemas Theatre stage manager, enrojecimiento o Actor de la inyeccin signos y sntomas de un cambio peligroso en el pulso o ritmo cardiaco, tales como dolor en el pecho; mareos; ritmo cardiaco rpido o irregular; palpitaciones; sensacin de desmayo o aturdimiento, cadas; problemas respiratorios signos de disminucin en la cantidad de plaquetas o sangrado: moretones, puntos rojos en la piel, heces de color negro y aspecto alquitranado, sangre en la orina signos de disminucin en la cantidad de glbulos rojos: cansancio o debilidad inusual, sensacin de Secondary school teacher o aturdimiento, cadas signos de infeccin: fiebre o escalofros, tos, dolor de garganta, dolor o  dificultad para orinar signos y sntomas de lesin al rin, tales como dificultad para orinar o cambios en la cantidad de orina signos y sntomas de lesin al hgado, como orina amarilla oscura o [place]; sensacin general de estar enfermo o sntomas gripales; heces claras; prdida del apetito; nuseas; dolor en la regin abdominal superior derecha; cansancio o debilidad inusual; color amarillento de los ojos o la piel hinchazn de tobillos, pies, manos Efectos secundarios que generalmente no requieren atencin mdica (infrmelos a su mdico o a su profesional de la salud si persisten o si son molestos): estreimiento diarrea cada del cabello prdida del apetito nuseas erupcin cutnea vmito Puede ser que esta lista no menciona todos los posibles efectos secundarios. Comunquese a su mdico por asesoramiento mdico [employer]. Usted puede informar los efectos secundarios a la FDA por telfono al 1-800-FDA-1088. Dnde debo guardar mi medicina? Este medicamento se administra en hospitales o clnicas y no necesitar guardarlo en su domicilio. ATENCIN: Este folleto es un resumen. Puede ser que no cubra toda la posible informacin. Si usted tiene preguntas acerca de esta medicina, consulte con su mdico, su farmacutico o su profesional de Technical sales engineer.  2023 Elsevier/Gold Standard (2017-07-05 00:00:00) Cisplatin Injection Qu es este medicamento? El CISPLATINO es un agente quimioteraputico. Este medicamento acta sobre las clulas que se dividen rpidamente, como las clulas cancerosas, y finalmente provoca la [place]  de estas clulas. Se utiliza en el tratamiento del muchos tipos de cncer tales como cncer de vejiga, ovario y testculo. Este medicamento puede ser utilizado para otros usos; si tiene alguna pregunta consulte con su proveedor de atencin mdica o con su farmacutico. MARCAS COMUNES: Platinol, Platinol -AQ Qu le debo informar a mi profesional de la salud antes  de tomar este medicamento? Necesitan saber si usted presenta alguno de los [employer] o situaciones: enfermedad ocular, problemas de la visin problemas de la audicin enfermedad renal recuentos sanguneos bajos, como baja cantidad de glbulos blancos, plaquetas o glbulos rojos hormigueo en las manos o los pies, u otro trastorno del sistema nervioso una reaccin alrgica o inusual al cisplatino, al carboplatino, al oxaliplatino, a otros medicamentos, alimentos, colorantes o conservantes si est embarazada o buscando quedar embarazada si est amamantando a un beb Cmo debo [employer]? Este medicamento se administra como infusin en una vena. Un profesional de la salud especialmente capacitado lo administra en un hospital o clnica. Hable con su pediatra para informarse acerca del uso de este medicamento en nios. Puede requerir atencin especial. Sobredosis: Pngase en contacto inmediatamente con un centro toxicolgico o una sala de urgencia si usted cree que haya tomado demasiado medicamento. ATENCIN: [employer] es solo para usted. No comparta este medicamento con nadie. Qu sucede si me olvido de una dosis? Es importante no olvidar ninguna dosis. Informe a su mdico o a su profesional de la salud si no puede asistir a Photographer. Qu puede interactuar con este medicamento? Este medicamento podra interactuar con los siguientes frmacos: foscarnet ciertos antibiticos, tales como amikacina, gentamicina, neomicina, polimixina B, estreptomicina, tobramicina, vancomicina Puede ser que esta lista no menciona todas las posibles interacciones. Informe a su profesional de KB Home	[place] de [employer] productos a base de hierbas, medicamentos de [place] o suplementos nutritivos que est tomando. Si usted fuma, consume bebidas alcohlicas o si utiliza drogas ilegales, indqueselo tambin a su profesional de KB Home	[place]. Algunas sustancias pueden interactuar con su  medicamento. A qu debo estar atento al usar Coca-Cola? Se supervisar su estado de salud atentamente mientras reciba este medicamento. Tendr que hacerse anlisis de sangre importantes mientras est usando este medicamento. Este medicamento podra hacerle sentir un Nurse, mental health. Esto es normal, ya que la quimioterapia puede afectar tanto a las clulas sanas como a las clulas cancerosas. Si presenta algn efecto secundario, infrmelo. Contine con el tratamiento aun si se siente enfermo, a menos que su mdico le indique que lo suspenda. Este medicamento puede aumentar su riesgo de contraer una infeccin. Consulte a su profesional de la salud si tiene fiebre, escalofros, dolor de garganta o cualquier otro sntoma de resfro o gripe. No se trate usted mismo. Trate de no acercarse a personas que estn enfermas. Evite usar [employer] contienen aspirina, acetaminofeno, ibuprofeno, naproxeno o ketoprofeno, a menos que as lo indique su profesional de KB Home	[place]. Estos productos pueden ocultar la fiebre. Este medicamento podra aumentar el riesgo de moretones o sangrado. Consulte a su mdico o a su profesional de la salud si observa sangrados inusuales. Proceda con cuidado al cepillar sus dientes, usar hilo dental o Risk manager palillos para los dientes, ya que podra contraer una infeccin o Therapist, art con mayor facilidad. Si se somete a algn tratamiento dental, informe a su dentista que est [employer]. No debe quedar embarazada mientras est usando este medicamento o por 14 meses despus de dejar de usarlo. Las mujeres deben informar a Barrister's clerk  de la salud si estn buscando quedar embarazadas o si creen que podran estar embarazadas. Los hombres no deben Social worker a Social research officer, government estn recibiendo este medicamento y durante 11 meses despus de dejar de usarlo. Existe la posibilidad de que ocurran efectos secundarios graves en un beb sin nacer. Para obtener ms informacin,  hable con su profesional de KB Home	[place]. No debe amamantar a un beb mientras est [employer]. Este medicamento ha causado insuficiencia ovrica en [employer]. Este medicamento puede causar dificultades para [place]. Hable con su profesional de la salud si le preocupa su fertilidad. Este medicamento ha causado recuentos de esperma reducidos en algunos hombres. Esto puede hacer ms difcil que un hombre embarace a Musician. Hable con su profesional de la salud si le preocupa su fertilidad. Consuma lquidos segn las indicaciones mientras toma este medicamento. Esto le ayudar a [employer]. Hable con su mdico o profesional de la salud si tiene [place]. No se trate usted mismo. Qu efectos secundarios puedo tener al [employer] este medicamento? Efectos secundarios que debe informar a su mdico o a Barrister's clerk de la salud tan pronto como sea posible: Chief of Staff, tales como erupcin cutnea, comezn/picazn o urticaria, e hinchazn de la cara, los labios o la lengua visin borrosa cambios en la visin disminucin de la audicin o zumbido en los odos nuseas, Proofreader, enrojecimiento o Actor de Air cabin crew, hormigueo o entumecimiento de las manos o los pies signos y sntomas de Teacher, music, tales como heces con sangre o de color negro y aspecto alquitranado; [place] de color rojo o marrn oscuro; escupir sangre o material marrn que tiene el aspecto de posos (residuos) de caf; Tree surgeon rojas en la piel; sangrado o moretones inusuales en los ojos, las encas o la nariz signos y sntomas de infeccin, tales como fiebre, escalofros, tos, Social research officer, government de garganta, Social research officer, government o dificultad para Garment/textile technologist signos y sntomas de lesin renal, tales como dificultad para Garment/textile technologist o cambios en la cantidad de orina signos y sntomas de baja cantidad de glbulos rojos o anemia, tales como debilidad o cansancio inusuales; sensacin de desmayo o aturdimiento;  cadas; problemas para respirar Efectos secundarios que generalmente no requieren atencin mdica (infrmelos a su mdico o a Barrister's clerk de la salud si persisten o si son molestos): prdida del apetito llagas en la boca calambres musculares Puede ser que esta lista no menciona todos los posibles efectos secundarios. Comunquese a su mdico por asesoramiento mdico [employer]. Usted puede informar los efectos secundarios a la FDA por telfono al 1-800-FDA-1088. Dnde debo guardar mi medicina? Este medicamento se administra en hospitales o clnicas, y no necesitar guardarlo en su domicilio. ATENCIN: Este folleto es un resumen. Puede ser que no cubra toda la posible informacin. Si usted tiene preguntas acerca de esta medicina, consulte con su mdico, su farmacutico o su profesional de Technical sales engineer.  2023 Elsevier/Gold Standard (2019-06-28 00:00:00)

## 2021-11-13 NOTE — Progress Notes (Unsigned)
Okay to proceed with treatment today with 178m urine output per Dr. SAlen Blew

## 2021-11-13 NOTE — Progress Notes (Unsigned)
Pt c/o worsening blood in urine and passage of clots upon arrival to infusion clinic. [facility] MD notified. Per MD ok to proceed with chemo tx today. Urologist consulted. Pt has appointment on Monday. Urologist advised that pt should seek emergency treatment if he stops being able to pass urine over the weekend. Pt expressed understanding. All information communicated with help of translator.

## 2021-11-13 NOTE — Progress Notes (Signed)
Spoke to pt by way of the interpreter to introduce myself as his Arboriculturist and to discuss the [employer].  Pt would like to apply so he will provide proof of income on 11/20/21 or email it to me.  If approved I will give him an expense sheet.  He has my card for any questions or concerns he may have in the future.

## 2021-11-14 ENCOUNTER — Other Ambulatory Visit: Payer: Self-pay

## 2021-11-16 ENCOUNTER — Telehealth: Payer: Self-pay

## 2021-11-16 NOTE — Telephone Encounter (Signed)
-----   Message from Rafael Bihari, RN sent at 11/13/2021  4:52 PM EDT ----- Regarding: Dr Alen Blew pt, first time Gemcitabine and Cisplatin Dr Alen Blew pt, came in for first time Gemcitabine and Cisplatin. Tolerated infusions well. Needs call back.

## 2021-11-16 NOTE — Telephone Encounter (Signed)
Attempted to reach [name]. No answer and mail box full. Called [place] in contacts and left message that [name] could not be reached and she was on his contacts.  LM for [name] that this nurse was calling to see how Mr. Terrence Rivera  was doing after his treatment on 11-13-21. Please call back to Dr. Hazeline Junker nurse at [phone] if they have any questions or concerns regarding the treatment.

## 2021-11-20 ENCOUNTER — Encounter: Payer: Self-pay | Admitting: Oncology

## 2021-11-20 ENCOUNTER — Other Ambulatory Visit: Payer: Self-pay

## 2021-11-20 ENCOUNTER — Inpatient Hospital Stay: Payer: Medicare (Managed Care)

## 2021-11-20 ENCOUNTER — Other Ambulatory Visit: Payer: Self-pay | Admitting: Internal Medicine

## 2021-11-20 DIAGNOSIS — C679 Malignant neoplasm of bladder, unspecified: Secondary | ICD-10-CM

## 2021-11-20 DIAGNOSIS — Z5111 Encounter for antineoplastic chemotherapy: Secondary | ICD-10-CM | POA: Diagnosis not present

## 2021-11-20 DIAGNOSIS — D494 Neoplasm of unspecified behavior of bladder: Secondary | ICD-10-CM

## 2021-11-20 LAB — CBC WITH DIFFERENTIAL (CANCER CENTER ONLY)
Abs Immature Granulocytes: 0.08 10*3/uL — ABNORMAL HIGH (ref 0.00–0.07)
Basophils Absolute: 0 10*3/uL (ref 0.0–0.1)
Basophils Relative: 0 %
Eosinophils Absolute: 0 10*3/uL (ref 0.0–0.5)
Eosinophils Relative: 0 %
HCT: 28.7 % — ABNORMAL LOW (ref 39.0–52.0)
Hemoglobin: 9.2 g/dL — ABNORMAL LOW (ref 13.0–17.0)
Immature Granulocytes: 1 %
Lymphocytes Relative: 11 %
Lymphs Abs: 1.5 10*3/uL (ref 0.7–4.0)
MCH: 25.9 pg — ABNORMAL LOW (ref 26.0–34.0)
MCHC: 32.1 g/dL (ref 30.0–36.0)
MCV: 80.8 fL (ref 80.0–100.0)
Monocytes Absolute: 0.4 10*3/uL (ref 0.1–1.0)
Monocytes Relative: 3 %
Neutro Abs: 11 10*3/uL — ABNORMAL HIGH (ref 1.7–7.7)
Neutrophils Relative %: 85 %
Platelet Count: 140 10*3/uL — ABNORMAL LOW (ref 150–400)
RBC: 3.55 MIL/uL — ABNORMAL LOW (ref 4.22–5.81)
RDW: 16.3 % — ABNORMAL HIGH (ref 11.5–15.5)
Smear Review: NORMAL
WBC Count: 13 10*3/uL — ABNORMAL HIGH (ref 4.0–10.5)
nRBC: 0 % (ref 0.0–0.2)

## 2021-11-20 LAB — CMP (CANCER CENTER ONLY)
ALT: 109 U/L — ABNORMAL HIGH (ref 0–44)
AST: 75 U/L — ABNORMAL HIGH (ref 15–41)
Albumin: 2.8 g/dL — ABNORMAL LOW (ref 3.5–5.0)
Alkaline Phosphatase: 116 U/L (ref 38–126)
Anion gap: 9 (ref 5–15)
BUN: 37 mg/dL — ABNORMAL HIGH (ref 8–23)
CO2: 24 mmol/L (ref 22–32)
Calcium: 8.2 mg/dL — ABNORMAL LOW (ref 8.9–10.3)
Chloride: 101 mmol/L (ref 98–111)
Creatinine: 1.81 mg/dL — ABNORMAL HIGH (ref 0.61–1.24)
GFR, Estimated: 38 mL/min — ABNORMAL LOW (ref >60)
Glucose, Bld: 188 mg/dL — ABNORMAL HIGH (ref 70–99)
Potassium: 3.8 mmol/L (ref 3.5–5.1)
Sodium: 134 mmol/L — ABNORMAL LOW (ref 135–145)
Total Bilirubin: 0.7 mg/dL (ref 0.3–1.2)
Total Protein: 7.4 g/dL (ref 6.5–8.1)

## 2021-11-20 NOTE — Progress Notes (Signed)
Per Dr. Alen Blew, holding [phone] treatment today as the ALT is 109 and Creatinine is 1.81. Patient and wife understand.

## 2021-11-20 NOTE — Progress Notes (Signed)
Pt is approved for the $1000 [employer] grant.  

## 2021-11-23 ENCOUNTER — Ambulatory Visit (HOSPITAL_COMMUNITY)
Admission: RE | Admit: 2021-11-23 | Discharge: 2021-11-23 | Disposition: A | Discharge status: Home / Self Care | Payer: Medicare (Managed Care) | Source: Ambulatory Visit

## 2021-11-23 ENCOUNTER — Other Ambulatory Visit: Payer: Self-pay

## 2021-11-23 ENCOUNTER — Ambulatory Visit (HOSPITAL_COMMUNITY)
Admission: RE | Admit: 2021-11-23 | Discharge: 2021-11-23 | Disposition: A | Discharge status: Home / Self Care | Payer: Medicare (Managed Care) | Source: Ambulatory Visit | Attending: Oncology | Admitting: Oncology

## 2021-11-23 ENCOUNTER — Other Ambulatory Visit: Payer: Self-pay | Admitting: Oncology

## 2021-11-23 ENCOUNTER — Encounter (HOSPITAL_COMMUNITY): Payer: Self-pay

## 2021-11-23 DIAGNOSIS — Z8551 Personal history of malignant neoplasm of bladder: Secondary | ICD-10-CM | POA: Insufficient documentation

## 2021-11-23 DIAGNOSIS — I10 Essential (primary) hypertension: Secondary | ICD-10-CM | POA: Diagnosis not present

## 2021-11-23 DIAGNOSIS — C679 Malignant neoplasm of bladder, unspecified: Secondary | ICD-10-CM

## 2021-11-23 DIAGNOSIS — Z452 Encounter for adjustment and management of vascular access device: Secondary | ICD-10-CM | POA: Insufficient documentation

## 2021-11-23 HISTORY — PX: IR IMAGING GUIDED PORT INSERTION: IMG5740

## 2021-11-23 MED ORDER — MIDAZOLAM HCL 2 MG/2ML IJ SOLN
INTRAMUSCULAR | Status: DC | PRN
  Administered 2021-11-23: 1 mg via INTRAVENOUS

## 2021-11-23 MED ORDER — SODIUM CHLORIDE 0.9 % IV SOLN: INTRAVENOUS | Status: DC

## 2021-11-23 MED ORDER — FENTANYL CITRATE (PF) 100 MCG/2ML IJ SOLN
INTRAMUSCULAR | Status: DC | PRN
  Administered 2021-11-23: 25 ug via INTRAVENOUS

## 2021-11-23 MED ORDER — LIDOCAINE-EPINEPHRINE 1 %-1:100000 IJ SOLN
INTRAMUSCULAR | Status: AC
  Filled 2021-11-23: qty 1

## 2021-11-23 MED ORDER — MIDAZOLAM HCL 2 MG/2ML IJ SOLN
INTRAMUSCULAR | Status: AC
  Filled 2021-11-23: qty 2

## 2021-11-23 MED ORDER — MIDAZOLAM HCL 2 MG/2ML IJ SOLN
INTRAMUSCULAR | Status: DC | PRN
  Administered 2021-11-23: .5 mg via INTRAVENOUS

## 2021-11-23 MED ORDER — FENTANYL CITRATE (PF) 100 MCG/2ML IJ SOLN
INTRAMUSCULAR | Status: DC | PRN
  Administered 2021-11-23: 50 ug via INTRAVENOUS

## 2021-11-23 MED ORDER — LIDOCAINE-EPINEPHRINE 1 %-1:100000 IJ SOLN
INTRAMUSCULAR | Status: DC | PRN
  Administered 2021-11-23: 5 mL via INTRADERMAL

## 2021-11-23 MED ORDER — LIDOCAINE-EPINEPHRINE 1 %-1:100000 IJ SOLN
INTRAMUSCULAR | Status: DC | PRN
  Administered 2021-11-23: 6 mL via INTRADERMAL

## 2021-11-23 MED ORDER — FENTANYL CITRATE (PF) 100 MCG/2ML IJ SOLN
INTRAMUSCULAR | Status: AC
  Filled 2021-11-23: qty 2

## 2021-11-23 MED ORDER — HEPARIN SOD (PORK) LOCK FLUSH 100 UNIT/ML IV SOLN
INTRAVENOUS | Status: DC | PRN
  Administered 2021-11-23: 500 [IU] via INTRAVENOUS

## 2021-11-23 MED ORDER — HEPARIN SOD (PORK) LOCK FLUSH 100 UNIT/ML IV SOLN
INTRAVENOUS | Status: AC
  Filled 2021-11-23: qty 5

## 2021-11-23 NOTE — Procedures (Signed)
Interventional Radiology Procedure Note  Procedure: Placement of a right IJ approach single lumen PowerPort.  Tip is positioned at the superior cavoatrial junction and catheter is ready for immediate use.  Complications: No immediate Recommendations:  - Ok to shower tomorrow - Do not submerge for 7 days - Routine line care   Signed,  Dannell Raczkowski K. Keighley Deckman, MD   

## 2021-11-23 NOTE — Consult Note (Addendum)
Chief Complaint: Patient was seen in consultation today for Port-A-Cath placement  Referring Physician(s): Terrence Rivera  Supervising Physician: Terrence Rivera  Patient Status: [facility] - Out-pt  History of Present Illness: Terrence Rivera is a 81 y.o. male with past medical history of hypertension who presents now with bladder cancer diagnosed in July of this year.  He is scheduled today for Port-A-Cath placement to assist with treatment.  Past Medical History:  Diagnosis Date   Hypertension     Past Surgical History:  Procedure Laterality Date   CYSTOSCOPY Bilateral 09/23/2021   Procedure: CYSTOSCOPY TRANSURETHRAL RESECTION OF BLADDER TUMOR (TURBT);  Surgeon: Terrence Mallow, MD;  Location: [facility];  Service: Urology;  Laterality: Bilateral;   TRANSURETHRAL RESECTION OF PROSTATE N/A 09/23/2021   Procedure: TRANSURETHRAL RESECTION OF THE PROSTATE (TURP);  Surgeon: Terrence Mallow, MD;  Location: [facility];  Service: Urology;  Laterality: N/A;  1 HR FOR CASE    Allergies: Patient has no known allergies.  Medications: Prior to Admission medications   Medication Sig Start Date End Date Taking? Authorizing Provider  lisinopril (ZESTRIL) 20 MG tablet Take 1 tablet (20 mg total) by mouth daily. 06/14/19  Yes Rivera, Terrence Sessions, MD  lidocaine-prilocaine (EMLA) cream Apply 1 Application topically as needed. 10/30/21   Terrence Portela, MD  oxyCODONE (OXY IR/ROXICODONE) 5 MG immediate release tablet Take 1 tablet (5 mg total) by mouth every 4 (four) hours as needed for moderate pain. 09/24/21   Terrence Mallow, MD  prochlorperazine (COMPAZINE) 10 MG tablet Take 1 tablet (10 mg total) by mouth every 6 (six) hours as needed for nausea or vomiting. 10/30/21   Terrence Portela, MD     History reviewed. No pertinent family history.  Social History   Socioeconomic History   Marital status: Married    Spouse name: Not on file   Number of children: Not on file   Years of  education: Not on file   Highest education level: Not on file  Occupational History   Not on file  Tobacco Use   Smoking status: Never   Smokeless tobacco: Never  Vaping Use   Vaping Use: Never used  Substance and Sexual Activity   Alcohol use: Yes    Comment: occ/rare   Drug use: Not on file   Sexual activity: Not on file  Other Topics Concern   Not on file  Social History Narrative   Not on file   Social Determinants of Health   Financial Resource Strain: Not on file  Food Insecurity: Not on file  Transportation Needs: Not on file  Physical Activity: Not on file  Stress: Not on file  Social Connections: Not on file      Review of Systems currently denies fever, headache, chest pain, dyspnea, cough, abdominal/back pain, nausea, vomiting or bleeding  Vital Signs: BP (!) 142/89   Pulse 89   Temp 97.9 F (36.6 C) (Oral)   Resp 18   Ht '5\' 6"'$  (1.676 m)   Wt 140 lb (63.5 kg)   SpO2 99%   BMI 22.60 kg/m      Physical Exam awake, alert.  Chest clear to auscultation bilaterally.  Heart with regular rate and rhythm, positive murmur.  Abdomen soft, positive bowel sounds, nontender.  Extremities with full range of motion.  Imaging: No results found.  Labs:  CBC: Recent Labs    09/21/21 1528 09/24/21 0425 11/13/21 0903 11/20/21 1350  WBC 8.4 11.1* 10.8*  13.0*  HGB 11.5* 10.9* 9.0* 9.2*  HCT 36.1* 34.5* 28.4* 28.7*  PLT 238 217 371 140*    COAGS: No results for input(s): "INR", "APTT" in the last 8760 hours.  BMP: Recent Labs    09/21/21 1528 09/23/21 1656 11/13/21 0903 11/20/21 1350  NA 136 136 133* 134*  K 4.2 3.6 3.9 3.8  CL 103 102 101 101  CO2 '23 24 27 24  '$ GLUCOSE 112* 215* 180* 188*  BUN 30* 27* 28* 37*  CALCIUM 9.4 8.7* 8.6* 8.2*  CREATININE 0.89 0.99 1.24 1.81*  GFRNONAA >60 >60 59* 38*    LIVER FUNCTION TESTS: Recent Labs    11/13/21 0903 11/20/21 1350  BILITOT 0.4 0.7  AST 30 75*  ALT 47* 109*  ALKPHOS 87 116  PROT 7.4 7.4   ALBUMIN 3.1* 2.8*    TUMOR MARKERS: No results for input(s): "AFPTM", "CEA", "CA199", "CHROMGRNA" in the last 8760 hours.  Assessment and Plan: 81 y.o. male with past medical history of hypertension who presents now with bladder cancer diagnosed in July of this year.  He is scheduled today for Port-A-Cath placement to assist with treatment.Risks and benefits of image guided port-a-catheter placement was discussed with the patient via interpreter including, but not limited to bleeding, infection, pneumothorax, or fibrin sheath development and need for additional procedures.  All of the patient's questions were answered, patient is agreeable to proceed. Consent signed and in chart.    Thank you for this interesting consult.  I greatly enjoyed meeting Terrence Rivera and look forward to participating in their care.  A copy of this report was sent to the requesting provider on this date.  Electronically Signed: D. Rowe Robert, PA-C 11/23/2021, 10:56 AM   I spent a total of  20 minutes   in face to face in clinical consultation, greater than 50% of which was counseling/coordinating care for port a cath placement

## 2021-11-30 ENCOUNTER — Emergency Department (HOSPITAL_COMMUNITY): Payer: Medicare (Managed Care)

## 2021-11-30 ENCOUNTER — Other Ambulatory Visit: Payer: Self-pay

## 2021-11-30 ENCOUNTER — Encounter (HOSPITAL_COMMUNITY): Payer: Self-pay

## 2021-11-30 ENCOUNTER — Inpatient Hospital Stay (HOSPITAL_COMMUNITY)
Admission: EM | Admit: 2021-11-30 | Discharge: 2021-12-04 | DRG: 558 | Disposition: A | Discharge status: Home / Self Care | Payer: Medicare (Managed Care) | Attending: Internal Medicine | Admitting: Internal Medicine

## 2021-11-30 DIAGNOSIS — Z9079 Acquired absence of other genital organ(s): Secondary | ICD-10-CM

## 2021-11-30 DIAGNOSIS — D638 Anemia in other chronic diseases classified elsewhere: Secondary | ICD-10-CM | POA: Diagnosis present

## 2021-11-30 DIAGNOSIS — S62002A Unspecified fracture of navicular [scaphoid] bone of left wrist, initial encounter for closed fracture: Secondary | ICD-10-CM | POA: Diagnosis present

## 2021-11-30 DIAGNOSIS — N179 Acute kidney failure, unspecified: Secondary | ICD-10-CM | POA: Diagnosis present

## 2021-11-30 DIAGNOSIS — I081 Rheumatic disorders of both mitral and tricuspid valves: Secondary | ICD-10-CM | POA: Diagnosis present

## 2021-11-30 DIAGNOSIS — M65132 Other infective (teno)synovitis, left wrist: Secondary | ICD-10-CM | POA: Diagnosis not present

## 2021-11-30 DIAGNOSIS — M009 Pyogenic arthritis, unspecified: Secondary | ICD-10-CM | POA: Diagnosis present

## 2021-11-30 DIAGNOSIS — C679 Malignant neoplasm of bladder, unspecified: Secondary | ICD-10-CM | POA: Diagnosis present

## 2021-11-30 DIAGNOSIS — M7989 Other specified soft tissue disorders: Secondary | ICD-10-CM | POA: Diagnosis not present

## 2021-11-30 DIAGNOSIS — I10 Essential (primary) hypertension: Secondary | ICD-10-CM | POA: Diagnosis present

## 2021-11-30 DIAGNOSIS — M1990 Unspecified osteoarthritis, unspecified site: Principal | ICD-10-CM

## 2021-11-30 DIAGNOSIS — X58XXXA Exposure to other specified factors, initial encounter: Secondary | ICD-10-CM | POA: Diagnosis present

## 2021-11-30 DIAGNOSIS — Z79899 Other long term (current) drug therapy: Secondary | ICD-10-CM

## 2021-11-30 DIAGNOSIS — M19032 Primary osteoarthritis, left wrist: Secondary | ICD-10-CM | POA: Diagnosis present

## 2021-11-30 DIAGNOSIS — R700 Elevated erythrocyte sedimentation rate: Secondary | ICD-10-CM | POA: Diagnosis present

## 2021-11-30 HISTORY — DX: Malignant (primary) neoplasm, unspecified: C80.1

## 2021-11-30 LAB — BASIC METABOLIC PANEL
Anion gap: 7 (ref 5–15)
BUN: 28 mg/dL — ABNORMAL HIGH (ref 8–23)
CO2: 27 mmol/L (ref 22–32)
Calcium: 8.5 mg/dL — ABNORMAL LOW (ref 8.9–10.3)
Chloride: 103 mmol/L (ref 98–111)
Creatinine, Ser: 1.61 mg/dL — ABNORMAL HIGH (ref 0.61–1.24)
GFR, Estimated: 43 mL/min — ABNORMAL LOW (ref >60)
Glucose, Bld: 218 mg/dL — ABNORMAL HIGH (ref 70–99)
Potassium: 4.4 mmol/L (ref 3.5–5.1)
Sodium: 137 mmol/L (ref 135–145)

## 2021-11-30 LAB — CBC
HCT: 27.6 % — ABNORMAL LOW (ref 39.0–52.0)
Hemoglobin: 8.4 g/dL — ABNORMAL LOW (ref 13.0–17.0)
MCH: 25.5 pg — ABNORMAL LOW (ref 26.0–34.0)
MCHC: 30.4 g/dL (ref 30.0–36.0)
MCV: 83.9 fL (ref 80.0–100.0)
Platelets: 617 10*3/uL — ABNORMAL HIGH (ref 150–400)
RBC: 3.29 MIL/uL — ABNORMAL LOW (ref 4.22–5.81)
RDW: 17.6 % — ABNORMAL HIGH (ref 11.5–15.5)
WBC: 9.6 10*3/uL (ref 4.0–10.5)
nRBC: 0 % (ref 0.0–0.2)

## 2021-11-30 MED ORDER — OXYCODONE HCL 5 MG PO TABS
10.0000 mg | ORAL_TABLET | Freq: Once | ORAL | Status: AC
  Administered 2021-11-30: 10 mg via ORAL
  Filled 2021-11-30: qty 2

## 2021-11-30 NOTE — ED Provider Triage Note (Signed)
Emergency Medicine Provider Triage Evaluation Note  Terrence Rivera , a 81 y.o. male  was evaluated in triage.  Pt complains of left hand swelling, painful, no falls or injuries. Recent port in right chest for prostate cancer, last chemo 3 weeks ago.  Review of Systems  Positive: Hand pain and swelling Negative: fever  Physical Exam  BP (!) 173/98   Pulse (!) 101   Temp 99.1 F (37.3 C) (Oral)   Resp 18   Ht '5\' 8"'$  (1.727 m)   Wt 65.8 kg   SpO2 98%   BMI 22.05 kg/m  Gen:   Awake, no distress   Resp:  Normal effort  MSK:   Swelling with ecchymosis to left hand Other:    Medical Decision Making  Medically screening exam initiated at 4:10 PM.  Appropriate orders placed.  Terrence Rivera was informed that the remainder of the evaluation will be completed by another provider, this initial triage assessment does not replace that evaluation, and the importance of remaining in the ED until their evaluation is complete.     Tacy Learn, PA-C 11/30/21 1611

## 2021-11-30 NOTE — ED Triage Notes (Signed)
Patient c/o left hand swelling and pain x 2 days. Patient  states that he has not had any injury or IV's  Patient states he has only had one round chemo since having prostate surgery.

## 2021-11-30 NOTE — ED Provider Notes (Signed)
[facility] DEPT Provider Note   CSN: [id number] Arrival date & time: 11/30/21  1430     History  No chief complaint on file.   Terrence Rivera is a 81 y.o. male.  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  He presents to the ED for evaluation of left wrist and hand swelling.  Patient denies any recent injuries.  He started noticing swelling in the last couple of days.  Patient states the pain and redness and swelling has increased.  He came to the ED for evaluation.  Patient last received chemotherapy treatment couple weeks ago.  He denies any fevers or chills. Home Medications Prior to Admission medications   Medication Sig Start Date End Date Taking? Authorizing Provider  lidocaine-prilocaine (EMLA) cream Apply 1 Application topically as needed. 10/30/21   Wyatt Portela, MD  lisinopril (ZESTRIL) 20 MG tablet Take 1 tablet (20 mg total) by mouth daily. 06/14/19   Cardama, Grayce Sessions, MD  oxyCODONE (OXY IR/ROXICODONE) 5 MG immediate release tablet Take 1 tablet (5 mg total) by mouth every 4 (four) hours as needed for moderate pain. 09/24/21   Lucas Mallow, MD  prochlorperazine (COMPAZINE) 10 MG tablet Take 1 tablet (10 mg total) by mouth every 6 (six) hours as needed for nausea or vomiting. 10/30/21   Wyatt Portela, MD      Allergies    Patient has no known allergies.    Review of Systems   Review of Systems  Physical Exam Updated Vital Signs BP (!) 175/85   Pulse (!) 104   Temp 99.1 F (37.3 C) (Oral)   Resp 18   Ht 1.727 m ('5\' 8"' )   Wt 65.8 kg   SpO2 97%   BMI 22.05 kg/m  Physical Exam Vitals and nursing note reviewed.  Constitutional:      General: He is not in acute distress.    Appearance: He is well-developed.  HENT:     Head: Normocephalic and atraumatic.     Right Ear: External ear normal.     Left Ear: External ear normal.  Eyes:     General: No scleral icterus.        Right eye: No discharge.        Left eye: No discharge.     Conjunctiva/sclera: Conjunctivae normal.  Neck:     Trachea: No tracheal deviation.  Cardiovascular:     Rate and Rhythm: Normal rate and regular rhythm.  Pulmonary:     Effort: Pulmonary effort is normal. No respiratory distress.     Breath sounds: Normal breath sounds. No stridor.  Abdominal:     General: There is no distension.  Musculoskeletal:        General: Swelling, tenderness and deformity present.     Left wrist: Swelling and tenderness present. Decreased range of motion.     Cervical back: Neck supple.     Comments: Patient with tenderness to palpation of the left wrist, pain with range of motion  Skin:    General: Skin is warm and dry.     Findings: No rash.  Neurological:     Mental Status: He is alert.     Cranial Nerves: Cranial nerve deficit: no gross deficits.     ED Results / Procedures / Treatments   Labs (all labs ordered are listed, but only abnormal results are displayed) Labs Reviewed - No data to display  EKG  None  Radiology DG Hand Complete Left  Result Date: 11/30/2021 CLINICAL DATA:  Left hand swelling EXAM: LEFT HAND - COMPLETE 3 VIEW; LEFT WRIST - COMPLETE 3 VIEW COMPARISON:  None Available. FINDINGS: No evidence of acute fracture or dislocation. Chronic nonunion of scaphoid fracture. Severe degenerative changes of the radiocarpal joint and triscaphe joint. Mild soft tissue swelling about the wrist. 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 Electronically Signed   By: Yetta Glassman M.D.   On: 11/30/2021 17:04   DG Wrist Complete Left  Result Date: 11/30/2021 CLINICAL DATA:  Left hand swelling EXAM: LEFT HAND - COMPLETE 3 VIEW; LEFT WRIST - COMPLETE 3 VIEW COMPARISON:  None Available. FINDINGS: No evidence of acute fracture or dislocation. Chronic nonunion of scaphoid fracture. Severe degenerative changes of the radiocarpal joint and  triscaphe joint. Mild soft tissue swelling about the wrist. 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 Electronically Signed   By: Yetta Glassman M.D.   On: 11/30/2021 17:04    Procedures Procedures    Medications Ordered in ED Medications - No data to display  ED Course/ Medical Decision Making/ A&P Clinical Course as of 12/01/21 0002  Mon Nov 30, 2021  2241 X-ray of the wrist shows chronic nonunion of the scaphoid.  Degenerative changes noted [JK]  2332 CBC(!) White blood cell count normal.  Hemoglobin slightly decreased compared to previous [JK]  5643 Basic metabolic panel(!) Hyperglycemia noted.  Creatinine at baseline [JK]    Clinical Course User Index [JK] Dorie Rank, MD                           Medical Decision Making Differential diagnosis includes inflammatory arthritis, infectious septic arthritis, Hologic fracture  Amount and/or Complexity of Data Reviewed Labs: ordered. Decision-making details documented in ED Course. Radiology: ordered and independent interpretation performed. Decision-making details documented in ED Course.  Risk Prescription drug management.   Patient presented to ED with acute swelling.  Findings concerning for the possibility of infectious arthritis.  Patient does have history of pseudogout.  Inflammatory arthritis is another possibility.  He does have significant warmth and tenderness of his left wrist.  We will check CBC ESR CRP.  Care turned over to Dr Waverly Ferrari.        Final Clinical Impression(s) / ED Diagnoses pending   Dorie Rank, MD 12/01/21 0002

## 2021-12-01 ENCOUNTER — Observation Stay (HOSPITAL_COMMUNITY): Payer: Medicare (Managed Care)

## 2021-12-01 ENCOUNTER — Observation Stay (HOSPITAL_BASED_OUTPATIENT_CLINIC_OR_DEPARTMENT_OTHER): Payer: Medicare (Managed Care)

## 2021-12-01 DIAGNOSIS — M19032 Primary osteoarthritis, left wrist: Secondary | ICD-10-CM | POA: Diagnosis present

## 2021-12-01 DIAGNOSIS — R011 Cardiac murmur, unspecified: Secondary | ICD-10-CM

## 2021-12-01 LAB — SEDIMENTATION RATE: Sed Rate: 133 mm/hr — ABNORMAL HIGH (ref 0–16)

## 2021-12-01 LAB — CBC WITH DIFFERENTIAL/PLATELET
Abs Immature Granulocytes: 0.19 10*3/uL — ABNORMAL HIGH (ref 0.00–0.07)
Basophils Absolute: 0.1 10*3/uL (ref 0.0–0.1)
Basophils Relative: 1 %
Eosinophils Absolute: 0 10*3/uL (ref 0.0–0.5)
Eosinophils Relative: 0 %
HCT: 23.8 % — ABNORMAL LOW (ref 39.0–52.0)
Hemoglobin: 7.4 g/dL — ABNORMAL LOW (ref 13.0–17.0)
Immature Granulocytes: 2 %
Lymphocytes Relative: 21 %
Lymphs Abs: 2 10*3/uL (ref 0.7–4.0)
MCH: 26 pg (ref 26.0–34.0)
MCHC: 31.1 g/dL (ref 30.0–36.0)
MCV: 83.5 fL (ref 80.0–100.0)
Monocytes Absolute: 1.5 10*3/uL — ABNORMAL HIGH (ref 0.1–1.0)
Monocytes Relative: 16 %
Neutro Abs: 5.8 10*3/uL (ref 1.7–7.7)
Neutrophils Relative %: 60 %
Platelets: 531 10*3/uL — ABNORMAL HIGH (ref 150–400)
RBC: 2.85 MIL/uL — ABNORMAL LOW (ref 4.22–5.81)
RDW: 17.8 % — ABNORMAL HIGH (ref 11.5–15.5)
WBC: 9.5 10*3/uL (ref 4.0–10.5)
nRBC: 0 % (ref 0.0–0.2)

## 2021-12-01 LAB — SYNOVIAL CELL COUNT + DIFF, W/ CRYSTALS
Crystals, Fluid: NONE SEEN
Eosinophils-Synovial: 0 % (ref 0–1)
Lymphocytes-Synovial Fld: 1 % (ref 0–20)
Monocyte-Macrophage-Synovial Fluid: 5 % — ABNORMAL LOW (ref 50–90)
Neutrophil, Synovial: 94 % — ABNORMAL HIGH (ref 0–25)
WBC, Synovial: 5670 /mm3 — ABNORMAL HIGH (ref 0–200)

## 2021-12-01 LAB — COMPREHENSIVE METABOLIC PANEL
ALT: 49 U/L — ABNORMAL HIGH (ref 0–44)
AST: 25 U/L (ref 15–41)
Albumin: 2.3 g/dL — ABNORMAL LOW (ref 3.5–5.0)
Alkaline Phosphatase: 90 U/L (ref 38–126)
Anion gap: 9 (ref 5–15)
BUN: 26 mg/dL — ABNORMAL HIGH (ref 8–23)
CO2: 25 mmol/L (ref 22–32)
Calcium: 8.2 mg/dL — ABNORMAL LOW (ref 8.9–10.3)
Chloride: 101 mmol/L (ref 98–111)
Creatinine, Ser: 1.67 mg/dL — ABNORMAL HIGH (ref 0.61–1.24)
GFR, Estimated: 41 mL/min — ABNORMAL LOW (ref >60)
Glucose, Bld: 141 mg/dL — ABNORMAL HIGH (ref 70–99)
Potassium: 4 mmol/L (ref 3.5–5.1)
Sodium: 135 mmol/L (ref 135–145)
Total Bilirubin: 0.6 mg/dL (ref 0.3–1.2)
Total Protein: 6.9 g/dL (ref 6.5–8.1)

## 2021-12-01 LAB — MAGNESIUM: Magnesium: 2 mg/dL (ref 1.7–2.4)

## 2021-12-01 LAB — ECHOCARDIOGRAM COMPLETE
AR max vel: 2.26 cm2
AV Peak grad: 5.7 mmHg
Ao pk vel: 1.2 m/s
Area-P 1/2: 3.85 cm2
Height: 68 in
MV M vel: 5.35 m/s
MV Peak grad: 114.5 mmHg
S' Lateral: 3.2 cm
Weight: 2320 oz

## 2021-12-01 LAB — C-REACTIVE PROTEIN
CRP: 15.8 mg/dL — ABNORMAL HIGH (ref <1.0)
CRP: 19.7 mg/dL — ABNORMAL HIGH (ref <1.0)

## 2021-12-01 MED ORDER — ONDANSETRON HCL 4 MG/2ML IJ SOLN: 4.0000 mg | Freq: Four times a day (QID) | INTRAMUSCULAR | Status: DC | PRN

## 2021-12-01 MED ORDER — ACETAMINOPHEN 325 MG PO TABS
650.0000 mg | ORAL_TABLET | Freq: Four times a day (QID) | ORAL | Status: DC | PRN
  Administered 2021-12-01 – 2021-12-02 (×3): 650 mg via ORAL
  Filled 2021-12-01 (×3): qty 2

## 2021-12-01 MED ORDER — GADOPICLENOL 0.5 MMOL/ML IV SOLN
6.0000 mL | Freq: Once | INTRAVENOUS | Status: AC | PRN
  Administered 2021-12-01: 6 mL via INTRAVENOUS

## 2021-12-01 MED ORDER — FENTANYL CITRATE PF 50 MCG/ML IJ SOSY: 50.0000 ug | PREFILLED_SYRINGE | INTRAMUSCULAR | Status: DC | PRN

## 2021-12-01 MED ORDER — SODIUM CHLORIDE 0.9 % IV SOLN
2.0000 g | Freq: Two times a day (BID) | INTRAVENOUS | Status: DC
  Administered 2021-12-01 – 2021-12-02 (×3): 2 g via INTRAVENOUS
  Filled 2021-12-01 (×3): qty 20

## 2021-12-01 MED ORDER — NALOXONE HCL 0.4 MG/ML IJ SOLN: 0.4000 mg | INTRAMUSCULAR | Status: DC | PRN

## 2021-12-01 MED ORDER — ACETAMINOPHEN 650 MG RE SUPP: 650.0000 mg | Freq: Four times a day (QID) | RECTAL | Status: DC | PRN

## 2021-12-01 MED ORDER — VANCOMYCIN HCL 750 MG/150ML IV SOLN
750.0000 mg | INTRAVENOUS | Status: DC
  Administered 2021-12-02 – 2021-12-04 (×3): 750 mg via INTRAVENOUS
  Filled 2021-12-01 (×3): qty 150

## 2021-12-01 MED ORDER — OXYCODONE HCL 5 MG PO TABS
5.0000 mg | ORAL_TABLET | ORAL | Status: DC | PRN
  Administered 2021-12-01 – 2021-12-02 (×2): 5 mg via ORAL
  Filled 2021-12-01 (×2): qty 1

## 2021-12-01 MED ORDER — VANCOMYCIN HCL 1500 MG/300ML IV SOLN
1500.0000 mg | Freq: Once | INTRAVENOUS | Status: AC
  Administered 2021-12-01: 1500 mg via INTRAVENOUS
  Filled 2021-12-01: qty 300

## 2021-12-01 MED ORDER — HYDRALAZINE HCL 25 MG PO TABS
25.0000 mg | ORAL_TABLET | Freq: Three times a day (TID) | ORAL | Status: DC
  Administered 2021-12-01 – 2021-12-04 (×9): 25 mg via ORAL
  Filled 2021-12-01 (×9): qty 1

## 2021-12-01 NOTE — ED Notes (Signed)
Patient goes to the bathroom frequently and therefore not connected to monitoring system.

## 2021-12-01 NOTE — Consult Note (Signed)
Reason for Consult:Left wrist pain Referring Physician: Babs Bertin Time called: 0730 Time at bedside: Klemme is an 81 y.o. male.  HPI: Ireoluwa comes in with 3d hx/o left wrist pain. He denies any antecedent event. He denies fevers, chills, sweats, N/V, or prior hx/o similar. He denies foreign travel. He denies gout. He had a presumptive dx of CPPD 5y ago but knee tap was negative for crystals. He is LHD.  Past Medical History:  Diagnosis Date   Cancer [facility])    Hypertension     Past Surgical History:  Procedure Laterality Date   CYSTOSCOPY Bilateral 09/23/2021   Procedure: CYSTOSCOPY TRANSURETHRAL RESECTION OF BLADDER TUMOR (TURBT);  Surgeon: Lucas Mallow, MD;  Location: [facility];  Service: Urology;  Laterality: Bilateral;   IR IMAGING GUIDED PORT INSERTION  11/23/2021   TRANSURETHRAL RESECTION OF PROSTATE N/A 09/23/2021   Procedure: TRANSURETHRAL RESECTION OF THE PROSTATE (TURP);  Surgeon: Lucas Mallow, MD;  Location: [facility];  Service: Urology;  Laterality: N/A;  1 HR FOR CASE    No family history on file.  Social History:  reports that he has never smoked. He has never used smokeless tobacco. He reports current alcohol use. He reports that he does not use drugs.  Allergies: No Known Allergies  Medications: I have reviewed the patient's current medications.  Results for orders placed or performed during the hospital encounter of 11/30/21 (from the past 48 hour(s))  CBC     Status: Abnormal   Collection Time: 11/30/21 11:09 PM  Result Value Ref Range   WBC 9.6 4.0 - 10.5 K/uL   RBC 3.29 (L) 4.22 - 5.81 MIL/uL   Hemoglobin 8.4 (L) 13.0 - 17.0 g/dL   HCT 27.6 (L) 39.0 - 52.0 %   MCV 83.9 80.0 - 100.0 fL   MCH 25.5 (L) 26.0 - 34.0 pg   MCHC 30.4 30.0 - 36.0 g/dL   RDW 17.6 (H) 11.5 - 15.5 %   Platelets 617 (H) 150 - 400 K/uL   nRBC 0.0 0.0 - 0.2 %    Comment: Performed at [facility], [place] [address], [place], [place]  [address]  Basic metabolic panel     Status: Abnormal   Collection Time: 11/30/21 11:09 PM  Result Value Ref Range   Sodium 137 135 - 145 mmol/L   Potassium 4.4 3.5 - 5.1 mmol/L   Chloride 103 98 - 111 mmol/L   CO2 27 22 - 32 mmol/L   Glucose, Bld 218 (H) 70 - 99 mg/dL    Comment: Glucose reference range applies only to samples taken after fasting for at least 8 hours.   BUN 28 (H) 8 - 23 mg/dL   Creatinine, Ser 1.61 (H) 0.61 - 1.24 mg/dL   Calcium 8.5 (L) 8.9 - 10.3 mg/dL   GFR, Estimated 43 (L) >60 mL/min    Comment: (NOTE) Calculated using the CKD-EPI Creatinine Equation (2021)    Anion gap 7 5 - 15    Comment: Performed at [facility], [place] [address], [place], [place] [address]  Sedimentation rate     Status: Abnormal   Collection Time: 11/30/21 11:09 PM  Result Value Ref Range   Sed Rate 133 (H) 0 - 16 mm/hr    Comment: Performed at [facility], [place] [address] [place],  [address]  C-reactive protein     Status: Abnormal   Collection Time: 11/30/21 11:09 PM  Result  Value Ref Range   CRP 15.8 (H) <1.0 mg/dL    Comment: Performed at [place] [address], [place], [place] [address]  CBC with Differential/Platelet     Status: Abnormal   Collection Time: 12/01/21  5:45 AM  Result Value Ref Range   WBC 9.5 4.0 - 10.5 K/uL   RBC 2.85 (L) 4.22 - 5.81 MIL/uL   Hemoglobin 7.4 (L) 13.0 - 17.0 g/dL   HCT 23.8 (L) 39.0 - 52.0 %   MCV 83.5 80.0 - 100.0 fL   MCH 26.0 26.0 - 34.0 pg   MCHC 31.1 30.0 - 36.0 g/dL   RDW 17.8 (H) 11.5 - 15.5 %   Platelets 531 (H) 150 - 400 K/uL   nRBC 0.0 0.0 - 0.2 %   Neutrophils Relative % 60 %   Neutro Abs 5.8 1.7 - 7.7 K/uL   Lymphocytes Relative 21 %   Lymphs Abs 2.0 0.7 - 4.0 K/uL   Monocytes Relative 16 %   Monocytes Absolute 1.5 (H) 0.1 - 1.0 K/uL   Eosinophils Relative 0 %   Eosinophils Absolute 0.0 0.0 - 0.5 K/uL   Basophils Relative 1 %   Basophils Absolute 0.1 0.0 - 0.1 K/uL    Immature Granulocytes 2 %   Abs Immature Granulocytes 0.19 (H) 0.00 - 0.07 K/uL    Comment: Performed at [facility], [place] [address] [place], [place] [address]  Comprehensive metabolic panel     Status: Abnormal   Collection Time: 12/01/21  5:45 AM  Result Value Ref Range   Sodium 135 135 - 145 mmol/L   Potassium 4.0 3.5 - 5.1 mmol/L   Chloride 101 98 - 111 mmol/L   CO2 25 22 - 32 mmol/L   Glucose, Bld 141 (H) 70 - 99 mg/dL    Comment: Glucose reference range applies only to samples taken after fasting for at least 8 hours.   BUN 26 (H) 8 - 23 mg/dL   Creatinine, Ser 1.67 (H) 0.61 - 1.24 mg/dL   Calcium 8.2 (L) 8.9 - 10.3 mg/dL   Total Protein 6.9 6.5 - 8.1 g/dL   Albumin 2.3 (L) 3.5 - 5.0 g/dL   AST 25 15 - 41 U/L   ALT 49 (H) 0 - 44 U/L   Alkaline Phosphatase 90 38 - 126 U/L   Total Bilirubin 0.6 0.3 - 1.2 mg/dL   GFR, Estimated 41 (L) >60 mL/min    Comment: (NOTE) Calculated using the CKD-EPI Creatinine Equation (2021)    Anion gap 9 5 - 15    Comment: Performed at [facility], [place] [address] [place], [place] [address]  Magnesium     Status: None   Collection Time: 12/01/21  5:45 AM  Result Value Ref Range   Magnesium 2.0 1.7 - 2.4 mg/dL    Comment: Performed at [facility], [place] [address] [place], [place] [address]    ECHOCARDIOGRAM COMPLETE  Result Date: 12/01/2021    ECHOCARDIOGRAM REPORT   Patient Name:   CLERANCE UMLAND Date of Exam: 12/01/2021 Medical Rec #:  [id number]             Height:       68.0 in Accession #:    [id number]            Weight:       145.0 lb Date of Birth:  12-15-1940  BSA:          1.783 m Patient Age:    28 years              BP:           160/84 mmHg Patient Gender: M                     HR:           86 bpm. Exam Location:  Inpatient Procedure: 2D Echo, Cardiac Doppler and Color Doppler Indications:    Murmur  History:        Patient has no prior history of  Echocardiogram examinations.                 Risk Factors:Hypertension.  Sonographer:    Jefferey Pica Referring Phys: [id number] Moca  1. Left ventricular ejection fraction, by estimation, is 55 to 60%. The left ventricle has normal function. The left ventricle has no regional wall motion abnormalities. There is mild asymmetric left ventricular hypertrophy of the basal-septal segment. Left ventricular diastolic parameters are consistent with Grade II diastolic dysfunction (pseudonormalization).  2. Right ventricular systolic function is normal. The right ventricular size is normal. There is severely elevated pulmonary artery systolic pressure. The estimated right ventricular systolic pressure is 92.1 mmHg.  3. Left atrial size was mildly dilated.  4. 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 There is eccentric mitral regurgitation with horizontal color splay seen in the apical views. 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 No evidence of mitral stenosis. Pulmonary veins not well assessed.  5. The tricuspid valve is normal. Tricuspid valve regurgitation is moderate to severe. Hepatic Doppler signal not performed.  6. The aortic valve is tricuspid. There is mild thickening of the aortic valve. Aortic valve regurgitation is not visualized. No aortic stenosis is present. Comparison(s): No prior Echocardiogram. Conclusion(s)/Recommendation(s): Consider TEE for further valve assessment if clinically indicated. FINDINGS  Left Ventricle: Left ventricular ejection fraction, by estimation, is 55 to 60%. The left ventricle has normal function. The left ventricle has no regional wall motion abnormalities. The left ventricular internal cavity size was normal in size. There is  mild asymmetric left ventricular hypertrophy of the basal-septal segment. Left ventricular diastolic parameters are consistent with Grade II diastolic dysfunction (pseudonormalization). Right Ventricle: The  right ventricular size is normal. No increase in right ventricular wall thickness. Right ventricular systolic function is normal. There is severely elevated pulmonary artery systolic pressure. The tricuspid regurgitant velocity is 3.87 m/s, and with an assumed right atrial pressure of 3 mmHg, the estimated right ventricular systolic pressure is 19.4 mmHg. Left Atrium: Left atrial size was mildly dilated. Right Atrium: Right atrial size was normal in size. Pericardium: There is no evidence of pericardial effusion. Mitral Valve: The mitral valve is abnormal. 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 No evidence of mitral valve stenosis. Tricuspid Valve: Bileaflet prolapse. The tricuspid valve is normal in structure. Tricuspid valve regurgitation is moderate to severe. Aortic Valve: The aortic valve is tricuspid. There is mild thickening of the aortic valve. Aortic valve regurgitation is not visualized. No aortic stenosis is present. Aortic valve peak gradient measures 5.7 mmHg. Pulmonic Valve: The pulmonic valve was normal in structure. Pulmonic valve regurgitation is trivial. No evidence of pulmonic stenosis. Aorta: The aortic root and ascending aorta are structurally normal, with no evidence of dilitation. IAS/Shunts: The atrial septum is grossly normal.  LEFT VENTRICLE PLAX 2D LVIDd:  5.50 cm   Diastology LVIDs:         3.20 cm   LV e' medial:    6.97 cm/s LV PW:         1.00 cm   LV E/e' medial:  18.5 LV IVS:        1.10 cm   LV e' lateral:   8.16 cm/s LVOT diam:     1.90 cm   LV E/e' lateral: 15.8 LV SV:         50 LV SV Index:   28 LVOT Area:     2.84 cm  RIGHT VENTRICLE             IVC RV Basal diam:  2.80 cm     IVC diam: 1.40 cm RV S prime:     20.00 cm/s TAPSE (M-mode): 1.8 cm LEFT ATRIUM             Index        RIGHT ATRIUM           Index LA diam:        3.90 cm 2.19 cm/m   RA Area:     14.70 cm LA Vol (A2C):   67.8 ml 38.03 ml/m  RA Volume:   36.90 ml  20.70 ml/m LA Vol (A4C):   59.2 ml  33.21 ml/m LA Biplane Vol: 64.1 ml 35.96 ml/m  AORTIC VALVE                 PULMONIC VALVE AV Area (Vmax): 2.26 cm     PV Vmax:       0.79 m/s AV Vmax:        119.50 cm/s  PV Peak grad:  2.5 mmHg AV Peak Grad:   5.7 mmHg LVOT Vmax:      95.30 cm/s LVOT Vmean:     57.600 cm/s LVOT VTI:       0.178 m  AORTA Ao Root diam: 2.90 cm Ao Asc diam:  3.40 cm MITRAL VALVE                TRICUSPID VALVE MV Area (PHT): 3.85 cm     TR Peak grad:   59.9 mmHg MV Decel Time: 197 msec     TR Vmax:        387.00 cm/s MR Peak grad: 114.5 mmHg MR Vmax:      535.00 cm/s   SHUNTS MV E velocity: 129.00 cm/s  Systemic VTI:  0.18 m MV A velocity: 108.00 cm/s  Systemic Diam: 1.90 cm MV E/A ratio:  1.19 Rudean Haskell MD Electronically signed by Rudean Haskell MD Signature Date/Time: 12/01/2021/10:52:55 AM    Final    DG Hand Complete Left  Result Date: 11/30/2021 CLINICAL DATA:  Left hand swelling EXAM: LEFT HAND - COMPLETE 3 VIEW; LEFT WRIST - COMPLETE 3 VIEW COMPARISON:  None Available. FINDINGS: No evidence of acute fracture or dislocation. Chronic nonunion of scaphoid fracture. Severe degenerative changes of the radiocarpal joint and triscaphe joint. Mild soft tissue swelling about the wrist. 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 Electronically Signed   By: Yetta Glassman M.D.   On: 11/30/2021 17:04   DG Wrist Complete Left  Result Date: 11/30/2021 CLINICAL DATA:  Left hand swelling EXAM: LEFT HAND - COMPLETE 3 VIEW; LEFT WRIST - COMPLETE 3 VIEW COMPARISON:  None Available. FINDINGS: No evidence of acute fracture or dislocation. Chronic nonunion of scaphoid fracture. Severe degenerative  changes of the radiocarpal joint and triscaphe joint. Mild soft tissue swelling about the wrist. 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 Electronically Signed   By: Yetta Glassman M.D.   On: 11/30/2021 17:04    Review  of Systems  Constitutional:  Negative for chills, diaphoresis and fever.  HENT:  Negative for ear discharge, ear pain, hearing loss and tinnitus.   Eyes:  Negative for photophobia and pain.  Respiratory:  Negative for cough and shortness of breath.   Cardiovascular:  Negative for chest pain.  Gastrointestinal:  Negative for abdominal pain, nausea and vomiting.  Genitourinary:  Negative for dysuria, flank pain, frequency and urgency.  Musculoskeletal:  Positive for arthralgias (Left wrist) and joint swelling. Negative for back pain, myalgias and neck pain.  Neurological:  Negative for dizziness and headaches.  Hematological:  Does not bruise/bleed easily.  Psychiatric/Behavioral:  The patient is not nervous/anxious.    Blood pressure (!) 159/96, pulse 87, temperature 98.2 F (36.8 C), temperature source Oral, resp. rate 18, height '5\' 8"'$  (1.727 m), weight 65.8 kg, SpO2 97 %. Physical Exam Constitutional:      General: He is not in acute distress.    Appearance: He is well-developed. He is not diaphoretic.  HENT:     Head: Normocephalic and atraumatic.  Eyes:     General: No scleral icterus.       Right eye: No discharge.        Left eye: No discharge.     Conjunctiva/sclera: Conjunctivae normal.  Cardiovascular:     Rate and Rhythm: Normal rate and regular rhythm.  Pulmonary:     Effort: Pulmonary effort is normal. No respiratory distress.  Musculoskeletal:     Cervical back: Normal range of motion.     Comments: Left shoulder, elbow, wrist, digits- no skin wounds, dorsal radial hand erythematous, edematous, minimal TTP, about 15 degrees painless AROM/PROM but severe pain with anything past that, no instability, no blocks to motion  Sens  Ax/R/M/U intact  Mot   Ax/ R/ PIN/ M/ AIN/ U intact  Rad 2+  Skin:    General: Skin is warm and dry.  Neurological:     Mental Status: He is alert.  Psychiatric:        Mood and Affect: Mood normal.        Behavior: Behavior normal.      Assessment/Plan: Left wrist pain -- Will give splint for comfort. Ask IR to aspirate wrist effusion. I also added CRP to help in diagnostic process.    Lisette Abu, PA-C Orthopedic Surgery [phone] 12/01/2021, 11:45 AM

## 2021-12-01 NOTE — H&P (Addendum)
History and Physical    Terrence Rivera JSR:[id number] DOB: Dec 20, 1940 DOA: 11/30/2021  PCP: [name], MD  Patient coming from:   I have personally briefly reviewed patient's old medical records in [place]  Chief Complaint: left hand swelling x 2 days  HPI: Terrence Rivera is a 81 y.o. male with medical history significant of HTN,malignant neoplasm of the bladder who presents to ed with complaint of left had and wrist swelling x 2 days. Patient notes no fever/ chills/ sob/chest pain / n/v/d. Note had remains painful. He states he has not had episode like this in the past.   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  Significant Physical exam  +new murmur   In ed concern for septic arthritis in setting of new murmur on exam  Echocardiogram pending, Case discussed with Dr. Judee Clara who recommended initiation of antibiotics  IMPRESSION: 1. Constellation of findings could reflect septic arthritis or inflammatory arthropathy. Large wrist effusion is amenable to arthrocentesis. 2. Moderate second and third extensor compartment tenosynovitis. Mild flexor tenosynovitis. Again, findings could reflect inflammatory arthropathy or infectious tenosynovitis. 3. 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 4. Moderate post-traumatic wrist osteoarthritis. 5. Full-thickness tear of the TFCC.ise 10 point review of systems negative.     Review of Systems: As per HPI other  Past Medical History:  Diagnosis Date   Cancer [facility])    Hypertension     Past Surgical History:  Procedure Laterality Date   CYSTOSCOPY Bilateral 09/23/2021   Procedure: CYSTOSCOPY TRANSURETHRAL RESECTION OF BLADDER TUMOR (TURBT);   Surgeon: Lucas Mallow, MD;  Location: [facility];  Service: Urology;  Laterality: Bilateral;   IR IMAGING GUIDED PORT INSERTION  11/23/2021   TRANSURETHRAL RESECTION OF PROSTATE N/A 09/23/2021   Procedure: TRANSURETHRAL RESECTION OF THE PROSTATE (TURP);  Surgeon: Lucas Mallow, MD;  Location: [facility];  Service: Urology;  Laterality: N/A;  1 HR FOR CASE     reports that he has never smoked. He has never used smokeless tobacco. He reports current alcohol use. He reports that he does not use drugs.  No Known Allergies  No family history on file.  Prior to Admission medications   Medication Sig Start Date End Date Taking? Authorizing Provider  lidocaine-prilocaine (EMLA) cream Apply 1 Application topically as needed. 10/30/21  Yes Wyatt Portela, MD  lisinopril (ZESTRIL) 20 MG tablet Take 1 tablet (20 mg total) by mouth daily. 06/14/19  Yes Cardama, Grayce Sessions, MD  prochlorperazine (COMPAZINE) 10 MG tablet Take 1 tablet (10 mg total) by mouth every 6 (six) hours as needed for nausea or vomiting. 10/30/21  Yes Wyatt Portela, MD  oxyCODONE (OXY IR/ROXICODONE) 5 MG immediate release tablet Take 1 tablet (5 mg total) by mouth every 4 (four) hours as needed for moderate pain. Patient not taking: Reported on 12/01/2021 09/24/21   Lucas Mallow, MD    Physical Exam: Vitals:   12/01/21 0500 12/01/21 0600 12/01/21 0626 12/01/21 0832  BP: (!) 147/85 (!) 160/84  (!) 150/101  Pulse: 92 89  97  Resp: 19 18  (!) 21  Temp:   98.4 F (36.9 C)  TempSrc:   Oral   SpO2: 96% 97%  100%  Weight:      Height:        Vitals:   12/01/21 0500 12/01/21 0600 12/01/21 0626 12/01/21 0832  BP: (!) 147/85 (!) 160/84  (!) 150/101  Pulse: 92 89  97  Resp: 19 18  (!) 21  Temp:   98.4 F (36.9 C)   TempSrc:   Oral   SpO2: 96% 97%  100%  Weight:      Height:       Constitutional: NAD, calm, comfortable Eyes: PERRL, lids and conjunctivae normal ENMT: Mucous membranes are moist. Posterior pharynx  clear of any exudate or lesions.Normal dentition.  Neck: normal, supple, no masses, no thyromegaly Respiratory: clear to auscultation bilaterally, no wheezing, no crackles. Normal respiratory effort. No accessory muscle use.  Cardiovascular: Regular rate and rhythm, no murmurs / rubs / gallops. No extremity edema. 2+ pedal pulses. Abdomen: no tenderness, no masses palpated. No hepatosplenomegaly. Bowel sounds positive.  Musculoskeletal: no clubbing / cyanosis. Left hand and wrist decrease rom due to pain , tender to touch  . Normal muscle tone.  Skin: no rashes, lesions, ulcers. No induration Neurologic: CN 2-12 grossly intact. Sensation intact, Strength 5/5 in all 4.  Psychiatric: Normal judgment and insight. Alert and oriented x 3. Normal mood.    Labs on Admission: I have personally reviewed following labs and imaging studies  CBC: Recent Labs  Lab 11/30/21 2309 12/01/21 0545  WBC 9.6 9.5  NEUTROABS  --  5.8  HGB 8.4* 7.4*  HCT 27.6* 23.8*  MCV 83.9 83.5  PLT 617* 564*   Basic Metabolic Panel: Recent Labs  Lab 11/30/21 2309 12/01/21 0545  NA 137 135  K 4.4 4.0  CL 103 101  CO2 27 25  GLUCOSE 218* 141*  BUN 28* 26*  CREATININE 1.61* 1.67*  CALCIUM 8.5* 8.2*  MG  --  2.0   GFR: Estimated Creatinine Clearance: 33.4 mL/min (A) (by C-G formula based on SCr of 1.67 mg/dL (H)). Liver Function Tests: Recent Labs  Lab 12/01/21 0545  AST 25  ALT 49*  ALKPHOS 90  BILITOT 0.6  PROT 6.9  ALBUMIN 2.3*   No results for input(s): "LIPASE", "AMYLASE" in the last 168 hours. No results for input(s): "AMMONIA" in the last 168 hours. Coagulation Profile: No results for input(s): "INR", "PROTIME" in the last 168 hours. Cardiac Enzymes: No results for input(s): "CKTOTAL", "CKMB", "CKMBINDEX", "TROPONINI" in the last 168 hours. BNP (last 3 results) No results for input(s): "PROBNP" in the last 8760 hours. HbA1C: No results for input(s): "HGBA1C" in the last 72  hours. CBG: No results for input(s): "GLUCAP" in the last 168 hours. Lipid Profile: No results for input(s): "CHOL", "HDL", "LDLCALC", "TRIG", "CHOLHDL", "LDLDIRECT" in the last 72 hours. Thyroid Function Tests: No results for input(s): "TSH", "T4TOTAL", "FREET4", "T3FREE", "THYROIDAB" in the last 72 hours. Anemia Panel: No results for input(s): "VITAMINB12", "FOLATE", "FERRITIN", "TIBC", "IRON", "RETICCTPCT" in the last 72 hours. Urine analysis: No results found for: "COLORURINE", "APPEARANCEUR", "LABSPEC", "PHURINE", "GLUCOSEU", "HGBUR", "BILIRUBINUR", "KETONESUR", "PROTEINUR", "UROBILINOGEN", "NITRITE", "LEUKOCYTESUR"  Radiological Exams on Admission: DG Hand Complete Left  Result Date: 11/30/2021 CLINICAL DATA:  Left hand swelling EXAM: LEFT HAND - COMPLETE 3 VIEW; LEFT WRIST - COMPLETE 3 VIEW COMPARISON:  None Available. FINDINGS: No evidence of acute fracture or dislocation. Chronic nonunion of scaphoid fracture. Severe degenerative changes of the radiocarpal joint and triscaphe joint. Mild soft tissue swelling about the wrist.  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 Electronically Signed   By: Yetta Glassman M.D.   On: 11/30/2021 17:04   DG Wrist Complete Left  Result Date: 11/30/2021 CLINICAL DATA:  Left hand swelling EXAM: LEFT HAND - COMPLETE 3 VIEW; LEFT WRIST - COMPLETE 3 VIEW COMPARISON:  None Available. FINDINGS: No evidence of acute fracture or dislocation. Chronic nonunion of scaphoid fracture. Severe degenerative changes of the radiocarpal joint and triscaphe joint. Mild soft tissue swelling about the wrist. 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 Electronically Signed   By: Yetta Glassman M.D.   On: 11/30/2021 17:04    EKG: Independently reviewed.   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  --no vegetations noted  - consider cardiology consult to assist with further evaluation   Bladder Cancer -on chemo, last infusion 2 weeks ago  -f/u with oncology as outpatient   HTN - currently elevated  -treat pain -resume home regimen as able  -prn as needed  DVT prophylaxis: scd  Code Status: full Family Communication: none at beside Disposition Plan: patient  expected to be admitted greater than 2 midnights  Consults called: Dr Judee Clara : hand surgery  Admission status: inpatient    Clance Boll MD [facility]   If 7PM-7AM, please contact night-coverage [email or web address] Password [email or web address]  12/01/2021, 10:14 AM

## 2021-12-01 NOTE — ED Notes (Signed)
Pt ambulated to BR, steady gait noted 

## 2021-12-01 NOTE — ED Provider Notes (Signed)
Patient signed out to me by Dr. Tomi Bamberger.  Patient being worked up for painful swelling left wrist.  Patient currently undergoing treatment for malignant neoplasm of the bladder, last chemo was 2 weeks ago.  Patient is not neutropenic.  Examination of the wrist reveals diffuse swelling, significant warmth and erythema.  Patient has no range of motion secondary to pain.  This raises concern for possible septic joint secondary to his immunocompromise state.  Inflammatory markers were sent.  Sed rate is 133.  Certainly this could be affected by his cancer and chemo but it is concerning.  I did reexamine the wrist and I do not see any clear approach for arthrocentesis that would not go through erythematous, warm skin.  Additionally, patient has a loud systolic murmur.  Upon further questioning, he has never been told that he has a murmur.  Looking through previous records, I do not see any murmurs documented.  This is concerning for possible endocarditis, which could explain bacteremia seeding the wrist.  Discussed with Dr. Doreatha Martin who was covering for hand surgery.  He will see the patient in consultation.  I do feel the patient requires hospitalization for further work-up of possible endocarditis.  Dr. Doreatha Martin felt that the patient could empirically be started on antibiotics without affecting work-up of the wrist.   Orpah Greek, MD 12/01/21 [phone]

## 2021-12-01 NOTE — ED Notes (Signed)
Pt ambulate to BR, steady gait noted.

## 2021-12-01 NOTE — Progress Notes (Signed)
  Carryover admission to the Day Admitter.  I discussed this case with the EDP, Dr. Betsey Holiday.  Per these discussions:   This is a 81 year old male with bladder cancer undergoing chemotherapy, most recent chemotherapy reportedly 2 weeks ago, who is being admitted for further evaluation and management acute arthropathy of the left wrist after presenting with 2 to 3 days of new onset left wrist pain, swelling, erythema, increased warmth, and diminished range of motion.  EDP discussed patient's case with on-call hand surgeon, Dr. Judee Clara, Who will formally consult and see the patient in the morning.  Dr.Haddock Is okay with initiation of IV antibiotics overnight.  Of note, EP conveys that physical exam reveals heart murmur, which has not been previously documented, increasing concern for potential septic arthritis on the basis of bacterial endocarditis.  Consequently, IV vancomycin and Bactrim endocarditis dosing of Rocephin has been started following collection of blood cultures.  Also ordered echocardiogram in the morning.  Of note, ESR is greater than 130.  N.p.o. in anticipation of hand surgery procedure in the morning.  Prn IV fentanyl.  As needed Zofran.  Of note, patient is Spanish speaking predominant .   I have placed an order for observation to med telemetry at [facility].   I have placed some additional preliminary admit orders via the adult multi-morbid admission order set, as well as additional orders as outlined above also, I placed orders for morning labs, including CMP, CBC differential, and serum magnesium level.Babs Bertin, DO Hospitalist

## 2021-12-01 NOTE — Progress Notes (Signed)
Pharmacy Antibiotic Note  Terrence Rivera is a 81 y.o. male admitted on 11/30/2021 with  septic arthritis . Concern for endocarditis.  Pharmacy has been consulted for Vancomycin dosing.  Admit with AKI- baseline Scr ~1.0  Plan: Rocephin per MD - endocarditis dosing Vancomycin '1500mg'$  IV x1 now then '750mg'$  IV q24h to target AUC 400-550.  Estimated AUC 490. Check Vancomycin levels at steady state Monitor renal function and cx data    Height: '5\' 8"'$  (172.7 cm) Weight: 65.8 kg (145 lb) IBW/kg (Calculated) : 68.4  Temp (24hrs), Avg:99.1 F (37.3 C), Min:98.9 F (37.2 C), Max:99.4 F (37.4 C)  Recent Labs  Lab 11/30/21 2309  WBC 9.6  CREATININE 1.61*    Estimated Creatinine Clearance: 34.6 mL/min (A) (by C-G formula based on SCr of 1.61 mg/dL (H)).    No Known Allergies  Antimicrobials this admission: 9/26 Rocephin >>  9/26 Vancomycin >>   Dose adjustments this admission:  Microbiology results: 9/26 BCx:   Thank you for allowing pharmacy to be a part of this patient's care.  Netta Cedars PharmD 12/01/2021 2:56 AM

## 2021-12-01 NOTE — ED Notes (Signed)
Pt accidentally urinated on self in bed, full linen change, pt changed from wet brief and pants into new/dry clothing and brief brought from home. Reposition in bed, warm blankets given.

## 2021-12-02 DIAGNOSIS — I10 Essential (primary) hypertension: Secondary | ICD-10-CM | POA: Diagnosis present

## 2021-12-02 DIAGNOSIS — R700 Elevated erythrocyte sedimentation rate: Secondary | ICD-10-CM | POA: Diagnosis present

## 2021-12-02 DIAGNOSIS — I081 Rheumatic disorders of both mitral and tricuspid valves: Secondary | ICD-10-CM | POA: Diagnosis present

## 2021-12-02 DIAGNOSIS — X58XXXA Exposure to other specified factors, initial encounter: Secondary | ICD-10-CM | POA: Diagnosis present

## 2021-12-02 DIAGNOSIS — S62002A Unspecified fracture of navicular [scaphoid] bone of left wrist, initial encounter for closed fracture: Secondary | ICD-10-CM | POA: Diagnosis present

## 2021-12-02 DIAGNOSIS — D638 Anemia in other chronic diseases classified elsewhere: Secondary | ICD-10-CM | POA: Diagnosis present

## 2021-12-02 DIAGNOSIS — M7989 Other specified soft tissue disorders: Secondary | ICD-10-CM | POA: Diagnosis present

## 2021-12-02 DIAGNOSIS — M19032 Primary osteoarthritis, left wrist: Secondary | ICD-10-CM | POA: Diagnosis present

## 2021-12-02 DIAGNOSIS — Z9079 Acquired absence of other genital organ(s): Secondary | ICD-10-CM | POA: Diagnosis not present

## 2021-12-02 DIAGNOSIS — N179 Acute kidney failure, unspecified: Secondary | ICD-10-CM | POA: Diagnosis present

## 2021-12-02 DIAGNOSIS — C679 Malignant neoplasm of bladder, unspecified: Secondary | ICD-10-CM | POA: Diagnosis present

## 2021-12-02 DIAGNOSIS — Z79899 Other long term (current) drug therapy: Secondary | ICD-10-CM | POA: Diagnosis not present

## 2021-12-02 DIAGNOSIS — M009 Pyogenic arthritis, unspecified: Secondary | ICD-10-CM | POA: Diagnosis present

## 2021-12-02 DIAGNOSIS — M65132 Other infective (teno)synovitis, left wrist: Secondary | ICD-10-CM | POA: Diagnosis present

## 2021-12-02 MED ORDER — SODIUM CHLORIDE 0.9 % IV SOLN: INTRAVENOUS | Status: DC

## 2021-12-02 MED ORDER — SODIUM CHLORIDE 0.9 % IV SOLN
2.0000 g | INTRAVENOUS | Status: DC
  Administered 2021-12-03 – 2021-12-04 (×2): 2 g via INTRAVENOUS
  Filled 2021-12-02 (×2): qty 20

## 2021-12-02 MED ORDER — PREDNISONE 20 MG PO TABS
40.0000 mg | ORAL_TABLET | Freq: Every day | ORAL | Status: DC
  Administered 2021-12-02 – 2021-12-04 (×3): 40 mg via ORAL
  Filled 2021-12-02 (×3): qty 2

## 2021-12-02 MED FILL — Dexamethasone Sodium Phosphate Inj 100 MG/10ML: INTRAMUSCULAR | Qty: 1 | Status: AC

## 2021-12-02 MED FILL — Fosaprepitant Dimeglumine For IV Infusion 150 MG (Base Eq): INTRAVENOUS | Qty: 5 | Status: AC

## 2021-12-02 NOTE — Plan of Care (Signed)

## 2021-12-02 NOTE — Progress Notes (Signed)
Port not access,Pt. Stated that his port is for chemo only. RN made aware.

## 2021-12-02 NOTE — Progress Notes (Signed)
PROGRESS NOTE    Terrence Rivera  WCH:[id number] DOB: 01/29/1941 DOA: 11/30/2021 PCP: [name], MD    Brief Narrative:   Terrence Rivera is a 81 y.o. male with past medical history significant for hypertension, malignant neoplasm of the bladder who presented to [facility] ED on 9/25 with left hand/wrist pain and swelling over the previous 2 days.  Denies any injury.  Patient reports decreased range of motion due to the swelling and pain.  Denies fever/chills, no shortness of breath, no chest pain, no nausea/vomiting/diarrhea.  In the ED, temperature 9 9.1 F, HR 101, RR 18, BP 173/98, SPO2 98% on room air.  WBC 9.6, hemoglobin 8.4, platelets 617.  Sodium 137, potassium 4.4, chloride 103, CO2 27, glucose 218, BUN 28, creatinine 1.61.  CRP 15.8.  ESR 133.  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  Orthopedics was consulted who recommended IR arthrocentesis.  TRH consulted for admission for concern of septic arthritis left wrist.  Assessment & Plan:   Infectious tenosynovitis, left wrist Patient presenting to the ED with 2-day history of progressive left wrist pain with decreased range of motion associated with edema.  Denies any injury. 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  Underwent IR arthrocentesis on 9/26.  Cell counts on joint aspiration with 5670 WBCs, neutrophil predominant; no crystals seen.  Seen by orthopedics, Dr. Doreatha Martin; believes this is not consistent with septic arthritis and recommended continue IV antibiotics, bracing anti-inflammatories and no indication to perform a formal I&D at this time. --Blood cultures x2: No growth x1 day --Joint aspiration culture: No organisms on Gram stain, abundant WBC present, no < 24 hours --Vancomycin, pharmacy consulted for dosing/monitoring --Ceftriaxone 2 g IV every 24 hours --prednisone 40m PO daily --Pain control with Tylenol oxycodone/fentanyl --Continue to trend inflammatory markers  Acute renal failure Creatinine on admission 1.81, baseline 0.99. --Cr 1.81>>1.67 --Hold home lisinopril --IVF w/ NS at 724mh --Avoid nephrotoxins, renal cell medications --Repeat BMP in a.m.  Essential hypertension On lisinopril 10 mg p.o. daily at baseline.  Currently holding due to acute renal failure as above. --Hydralazine 2581mO q8h -- Continue monitor BP closely  Normocytic anemia Unclear etiology, MCV within normal limits -- Hemoglobin 7.4 this morning -- Check anemia panel; repeat CBC in a.m.  DVT prophylaxis: SCDs Start: 12/01/21 0250    Code Status: Full Code Family Communication: Updated patient's spouse and grandson present at bedside this morning  Disposition Plan:  Level of care: Telemetry Status is: Inpatient Remains inpatient appropriate because: IV antibiotics    Consultants:  Orthopedics, Dr. HadDoreatha Martinrocedures:  IR left wrist joint aspiration, 9/26  Antimicrobials:  Vancomycin 9/25>> Ceftriaxone 9/25>>   Subjective: Patient seen examined bedside, resting comfortably.  Lying in bed.  Spouse and grandson present.  Grandson assist with translation.  Continues with pain and swelling to left wrist.  Underwent joint  aspiration by IR yesterday.  Seen by orthopedics  this morning, do not believe this is septic arthritis and no indication for surgical intervention at this point.  Recommend continue IV antibiotics.  No other specific questions or concerns at this time per patient or family.  Patient denies headache, no fever/chills/night sweats, no nausea/vomiting/diarrhea, no chest pain, no palpitations, no shortness of breath, no abdominal pain, no focal weakness, no fatigue, no cough/congestion, no paresthesias.  No acute events overnight per nursing staff.  Objective: Vitals:   12/01/21 2246 12/02/21 0248 12/02/21 0500 12/02/21 0623  BP: (!) 144/84 (!) 145/80  (!) 143/72  Pulse: 95 (!) 101  96  Resp: '20 20  20  ' Temp: 98.6 F (37 C) 99.6 F (37.6 C)  98.5 F (36.9 C)  TempSrc: Oral Oral  Oral  SpO2: 96% 96%  96%  Weight:   61.9 kg   Height:        Intake/Output Summary (Last 24 hours) at 12/02/2021 1408 Last data filed at 12/02/2021 0959 Gross per 24 hour  Intake 1048 ml  Output --  Net 1048 ml   Filed Weights   11/30/21 1529 12/02/21 0500  Weight: 65.8 kg 61.9 kg    Examination:  Physical Exam: GEN: NAD, alert and oriented x 3, wd/wn HEENT: NCAT, PERRL, EOMI, sclera clear, MMM PULM: CTAB w/o wheezes/crackles, normal respiratory effort, on room air CV: RRR w/o M/G/R GI: abd soft, NTND, NABS, no R/G/M MSK: Left wrist with tenderness to palpation both anterior/posterior aspect with decreased range of motion both passive and active due to pain, notably edematous, neurovascular intact otherwise, moves all extremities otherwise independently with grossly intact muscle strength. NEURO: CN II-XII intact, no focal deficits, sensation to light touch intact PSYCH: normal mood/affect Integumentary: Left wrist with edema/mild erythema, and  tenderness to palpation, otherwise no other concerning rashes/lesions/wounds     Data Reviewed: I have personally reviewed following labs and imaging  studies  CBC: Recent Labs  Lab 11/30/21 2309 12/01/21 0545  WBC 9.6 9.5  NEUTROABS  --  5.8  HGB 8.4* 7.4*  HCT 27.6* 23.8*  MCV 83.9 83.5  PLT 617* 861*   Basic Metabolic Panel: Recent Labs  Lab 11/30/21 2309 12/01/21 0545  NA 137 135  K 4.4 4.0  CL 103 101  CO2 27 25  GLUCOSE 218* 141*  BUN 28* 26*  CREATININE 1.61* 1.67*  CALCIUM 8.5* 8.2*  MG  --  2.0   GFR: Estimated Creatinine Clearance: 31.4 mL/min (A) (by C-G formula based on SCr of 1.67 mg/dL (H)). Liver Function Tests: Recent Labs  Lab 12/01/21 0545  AST 25  ALT 49*  ALKPHOS 90  BILITOT 0.6  PROT 6.9  ALBUMIN 2.3*   No results for input(s): "LIPASE", "AMYLASE" in the last 168 hours. No results for input(s): "AMMONIA" in the last 168 hours. Coagulation Profile: No results for input(s): "INR", "PROTIME" in the last 168 hours. Cardiac Enzymes: No results for input(s): "CKTOTAL", "CKMB", "CKMBINDEX", "TROPONINI" in the last 168 hours. BNP (last 3 results) No results for input(s): "PROBNP" in the last 8760 hours. HbA1C: No results for input(s): "HGBA1C" in the last 72 hours. CBG: No results for input(s): "GLUCAP" in the last 168 hours. Lipid Profile: No results for input(s): "CHOL", "HDL", "LDLCALC", "TRIG", "CHOLHDL", "LDLDIRECT" in the last 72 hours. Thyroid Function Tests: No results for input(s): "TSH", "T4TOTAL", "FREET4", "T3FREE", "THYROIDAB" in the last 72 hours. Anemia Panel: No results for input(s): "VITAMINB12", "FOLATE", "FERRITIN", "TIBC", "IRON", "RETICCTPCT" in the last 72 hours. Sepsis Labs: No results  for input(s): "PROCALCITON", "LATICACIDVEN" in the last 168 hours.  Recent Results (from the past 240 hour(s))  Culture, blood (Routine X 2) w Reflex to ID Panel     Status: None (Preliminary result)   Collection Time: 12/01/21  2:00 AM   Specimen: BLOOD  Result Value Ref Range Status   Specimen Description   Final    BLOOD BLOOD RIGHT FOREARM Performed at [facility] [address] [place], [place] [address]    Special Requests   Final    BOTTLES DRAWN AEROBIC AND ANAEROBIC Blood Culture results may not be optimal due to an excessive volume of blood received in culture bottles Performed at [facility] [address] [place], [place] [address]    Culture   Final    NO GROWTH 1 DAY Performed at [facility], [place] [address], [place], [place] [address]    Report Status PENDING  Incomplete  Culture, blood (Routine X 2) w Reflex to ID Panel     Status: None (Preliminary result)   Collection Time: 12/01/21  2:05 AM   Specimen: BLOOD  Result Value Ref Range Status   Specimen Description   Final    BLOOD LEFT ANTECUBITAL Performed at [facility] [address] [place], [place] [address]    Special Requests   Final    BOTTLES DRAWN AEROBIC AND ANAEROBIC Blood Culture adequate volume Performed at [facility] [address] [place], [place] [address]    Culture   Final    NO GROWTH 1 DAY Performed at [facility], [place] [address], [place], [place] [address]    Report Status PENDING  Incomplete  Body fluid culture w Gram Stain     Status: None (Preliminary result)   Collection Time: 12/01/21  3:41 PM   Specimen: Joint, Left Wrist; Synovial Fluid  Result Value Ref Range Status   Specimen Description   Final    SYNOVIAL Performed at [facility], [place] [address], [place], [place] [address]    Special Requests   Final    NONE JOINT, LEFT WRIST Performed at [facility] [address], [place], [place] [address]    Gram Stain   Final    ABUNDANT WBC PRESENT, PREDOMINANTLY PMN NO ORGANISMS SEEN    Culture   Final    NO GROWTH < 24 HOURS Performed at [facility] [address] [place], [place] [address]    Report Status PENDING  Incomplete         Radiology Studies: DG FLUORO GUIDED NEEDLE PLC  ASPIRATION/INJECTION LOC  Result Date: 12/01/2021 CLINICAL DATA:  Left wrist pain and swelling, effusion EXAM: LEFT WRIST ASPIRATION UNDER FLUOROSCOPY COMPARISON:  Same day MR. FLUOROSCOPY: Radiation Exposure Index (as provided by the fluoroscopic device): 0.40 mGy Kerma PROCEDURE: The risks (including hemorrhage, introduction of infection, and non diagnosis), benefits, and alternatives to fluoroscopically guided radiocarpal joint aspiration were discussed with the patient. He understood and elected to undergo the procedure. Standard time-out was employed. Overlying skin prepped with Betadine, draped in the usual sterile fashion, and infiltrated locally with 1% Lidocaine. An 18g needle advanced to the radiocarpal joint under fluoroscopic guidance and 2cc purulent material was aspirated. Needle removed and site bandaged. No immediate complication. IMPRESSION: Technically successful wrist aspiration yielding purulent fluid as above. Read and performed by: Alexandria Lodge, PA-C, supervised by Van Clines, MD Electronically Signed  By: Van Clines M.D.   On: 12/01/2021 16:09   MR WRIST LEFT W WO CONTRAST  Result Date: 12/01/2021 CLINICAL DATA:  Left hand pain, swelling, and redness for the past 2 days. No injury. EXAM: MR OF THE LEFT WRIST WITHOUT AND WITH CONTRAST TECHNIQUE: Multiplanar multisequence MR imaging of the left wrist was performed both before and after the administration of intravenous contrast. CONTRAST:  6 mL Vueway intravenous contrast. COMPARISON:  Left hand and wrist x-rays from yesterday. FINDINGS: Ligaments: Chronically torn scapholunate and lunotriquetral ligaments. Triangular fibrocartilage: Full-thickness tear of the ulnar aspect of the TFCC. Tendons: Intact flexor and extensor compartment tendons. Moderate amount of fluid in the second and third extensor compartment tendon sheaths with associated synovial enhancement. Small amount of fluid within the flexor tendon sheaths  with diffuse synovial enhancement proximal to the carpal tunnel. Carpal tunnel/median nerve: Normal carpal tunnel. Normal median nerve. Guyon's canal: Normal. Joint/cartilage: Large midcarpal, radiocarpal, and distal radioulnar joint effusions with thick synovial enhancement. 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 Moderate wrist osteoarthritis. Large degenerative cysts in the lunate and distal ulna. Small erosions of the distal radius articular surface. No suspicious bone lesion. Other: Diffuse soft tissue swelling.  No fluid collection. IMPRESSION: 1. Constellation of findings could reflect septic arthritis or inflammatory arthropathy. Large wrist effusion is amenable to arthrocentesis. 2. Moderate second and third extensor compartment tenosynovitis. Mild flexor tenosynovitis. Again, findings could reflect inflammatory arthropathy or infectious tenosynovitis. 3. 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 4. Moderate post-traumatic wrist osteoarthritis. 5. Full-thickness tear of the TFCC. Electronically Signed   By: Titus Dubin M.D.   On: 12/01/2021 12:06   ECHOCARDIOGRAM COMPLETE  Result Date: 12/01/2021    ECHOCARDIOGRAM REPORT   Patient Name:   KUNAL LEVARIO Date of Exam: 12/01/2021 Medical Rec #:  [id number]             Height:       68.0 in Accession #:    [id number]            Weight:       145.0 lb Date of Birth:  10/13/1940              BSA:          1.783 m Patient Age:    71 years              BP:           160/84 mmHg Patient Gender: M                     HR:           86 bpm. Exam Location:  Inpatient Procedure: 2D Echo, Cardiac Doppler and Color Doppler Indications:    Murmur  History:        Patient has no prior history of Echocardiogram examinations.                 Risk Factors:Hypertension.  Sonographer:    Jefferey Pica Referring Phys: [id number] [place]  1. Left ventricular ejection fraction, by estimation,  is 55 to 60%. The left ventricle has normal function. The left ventricle has no regional wall motion abnormalities. There is mild asymmetric left ventricular hypertrophy of the basal-septal segment. Left ventricular diastolic parameters are consistent with Grade II diastolic dysfunction (pseudonormalization).  2. Right ventricular systolic function is normal. The right ventricular size is normal. There is severely elevated pulmonary artery systolic  pressure. The estimated right ventricular systolic pressure is 10.1 mmHg.  3. Left atrial size was mildly dilated.  4. 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 There is eccentric mitral regurgitation with horizontal color splay seen in the apical views. 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 No evidence of mitral stenosis. Pulmonary veins not well assessed.  5. The tricuspid valve is normal. Tricuspid valve regurgitation is moderate to severe. Hepatic Doppler signal not performed.  6. The aortic valve is tricuspid. There is mild thickening of the aortic valve. Aortic valve regurgitation is not visualized. No aortic stenosis is present. Comparison(s): No prior Echocardiogram. Conclusion(s)/Recommendation(s): Consider TEE for further valve assessment if clinically indicated. FINDINGS  Left Ventricle: Left ventricular ejection fraction, by estimation, is 55 to 60%. The left ventricle has normal function. The left ventricle has no regional wall motion abnormalities. The left ventricular internal cavity size was normal in size. There is  mild asymmetric left ventricular hypertrophy of the basal-septal segment. Left ventricular diastolic parameters are consistent with Grade II diastolic dysfunction (pseudonormalization). Right Ventricle: The right ventricular size is normal. No increase in right ventricular wall thickness. Right ventricular systolic function is normal. There is severely elevated pulmonary artery systolic pressure. The tricuspid regurgitant  velocity is 3.87 m/s, and with an assumed right atrial pressure of 3 mmHg, the estimated right ventricular systolic pressure is 75.1 mmHg. Left Atrium: Left atrial size was mildly dilated. Right Atrium: Right atrial size was normal in size. Pericardium: There is no evidence of pericardial effusion. Mitral Valve: The mitral valve is abnormal. 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 No evidence of mitral valve stenosis. Tricuspid Valve: Bileaflet prolapse. The tricuspid valve is normal in structure. Tricuspid valve regurgitation is moderate to severe. Aortic Valve: The aortic valve is tricuspid. There is mild thickening of the aortic valve. Aortic valve regurgitation is not visualized. No aortic stenosis is present. Aortic valve peak gradient measures 5.7 mmHg. Pulmonic Valve: The pulmonic valve was normal in structure. Pulmonic valve regurgitation is trivial. No evidence of pulmonic stenosis. Aorta: The aortic root and ascending aorta are structurally normal, with no evidence of dilitation. IAS/Shunts: The atrial septum is grossly normal.  LEFT VENTRICLE PLAX 2D LVIDd:         5.50 cm   Diastology LVIDs:         3.20 cm   LV e' medial:    6.97 cm/s LV PW:         1.00 cm   LV E/e' medial:  18.5 LV IVS:        1.10 cm   LV e' lateral:   8.16 cm/s LVOT diam:     1.90 cm   LV E/e' lateral: 15.8 LV SV:         50 LV SV Index:   28 LVOT Area:     2.84 cm  RIGHT VENTRICLE             IVC RV Basal diam:  2.80 cm     IVC diam: 1.40 cm RV S prime:     20.00 cm/s TAPSE (M-mode): 1.8 cm LEFT ATRIUM             Index        RIGHT ATRIUM           Index LA diam:        3.90 cm 2.19 cm/m   RA Area:     14.70 cm LA Vol (A2C):   67.8 ml 38.03 ml/m  RA Volume:   36.90 ml  20.70 ml/m LA Vol (A4C):   59.2 ml 33.21 ml/m LA Biplane Vol: 64.1 ml 35.96 ml/m  AORTIC VALVE                 PULMONIC VALVE AV Area (Vmax): 2.26 cm     PV Vmax:       0.79 m/s AV Vmax:        119.50 cm/s  PV Peak grad:  2.5 mmHg AV Peak Grad:   5.7  mmHg LVOT Vmax:      95.30 cm/s LVOT Vmean:     57.600 cm/s LVOT VTI:       0.178 m  AORTA Ao Root diam: 2.90 cm Ao Asc diam:  3.40 cm MITRAL VALVE                TRICUSPID VALVE MV Area (PHT): 3.85 cm     TR Peak grad:   59.9 mmHg MV Decel Time: 197 msec     TR Vmax:        387.00 cm/s MR Peak grad: 114.5 mmHg MR Vmax:      535.00 cm/s   SHUNTS MV E velocity: 129.00 cm/s  Systemic VTI:  0.18 m MV A velocity: 108.00 cm/s  Systemic Diam: 1.90 cm MV E/A ratio:  1.19 Rudean Haskell MD Electronically signed by Rudean Haskell MD Signature Date/Time: 12/01/2021/10:52:55 AM    Final    DG Hand Complete Left  Result Date: 11/30/2021 CLINICAL DATA:  Left hand swelling EXAM: LEFT HAND - COMPLETE 3 VIEW; LEFT WRIST - COMPLETE 3 VIEW COMPARISON:  None Available. FINDINGS: No evidence of acute fracture or dislocation. Chronic nonunion of scaphoid fracture. Severe degenerative changes of the radiocarpal joint and triscaphe joint. Mild soft tissue swelling about the wrist. 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 Electronically Signed   By: Yetta Glassman M.D.   On: 11/30/2021 17:04   DG Wrist Complete Left  Result Date: 11/30/2021 CLINICAL DATA:  Left hand swelling EXAM: LEFT HAND - COMPLETE 3 VIEW; LEFT WRIST - COMPLETE 3 VIEW COMPARISON:  None Available. FINDINGS: No evidence of acute fracture or dislocation. Chronic nonunion of scaphoid fracture. Severe degenerative changes of the radiocarpal joint and triscaphe joint. Mild soft tissue swelling about the wrist. 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 Electronically Signed   By: Yetta Glassman M.D.   On: 11/30/2021 17:04        Scheduled Meds:  hydrALAZINE  25 mg Oral Q8H   Continuous Infusions:  [START ON 12/03/2021] cefTRIAXone (ROCEPHIN)  IV     vancomycin 750 mg (12/02/21 0510)     LOS: 0 days    Time spent: 54 minutes spent on  chart review, discussion with nursing staff, consultants, updating family and interview/physical exam; more than 50% of that time was spent in counseling and/or coordination of care.    Amed Datta J [place] DO [facility] Available via Epic secure chat 7am-7pm After these hours, please refer to coverage provider listed on [email or web address] 12/02/2021, 2:08 PM

## 2021-12-03 ENCOUNTER — Inpatient Hospital Stay: Payer: Medicare (Managed Care) | Admitting: Oncology

## 2021-12-03 ENCOUNTER — Inpatient Hospital Stay: Payer: Medicare (Managed Care)

## 2021-12-03 DIAGNOSIS — M19032 Primary osteoarthritis, left wrist: Secondary | ICD-10-CM | POA: Diagnosis not present

## 2021-12-03 LAB — FERRITIN: Ferritin: 203 ng/mL (ref 24–336)

## 2021-12-03 LAB — FOLATE: Folate: 10.4 ng/mL (ref >5.9)

## 2021-12-03 LAB — C-REACTIVE PROTEIN: CRP: 22.2 mg/dL — ABNORMAL HIGH (ref <1.0)

## 2021-12-03 LAB — IRON AND TIBC
Iron: 28 ug/dL — ABNORMAL LOW (ref 45–182)
Saturation Ratios: 14 % — ABNORMAL LOW (ref 17.9–39.5)
TIBC: 203 ug/dL — ABNORMAL LOW (ref 250–450)
UIBC: 175 ug/dL

## 2021-12-03 LAB — CBC
HCT: 23.9 % — ABNORMAL LOW (ref 39.0–52.0)
Hemoglobin: 7.2 g/dL — ABNORMAL LOW (ref 13.0–17.0)
MCH: 25.4 pg — ABNORMAL LOW (ref 26.0–34.0)
MCHC: 30.1 g/dL (ref 30.0–36.0)
MCV: 84.2 fL (ref 80.0–100.0)
Platelets: 511 10*3/uL — ABNORMAL HIGH (ref 150–400)
RBC: 2.84 MIL/uL — ABNORMAL LOW (ref 4.22–5.81)
RDW: 17.8 % — ABNORMAL HIGH (ref 11.5–15.5)
WBC: 8.9 10*3/uL (ref 4.0–10.5)
nRBC: 0 % (ref 0.0–0.2)

## 2021-12-03 LAB — BASIC METABOLIC PANEL
Anion gap: 10 (ref 5–15)
BUN: 24 mg/dL — ABNORMAL HIGH (ref 8–23)
CO2: 23 mmol/L (ref 22–32)
Calcium: 8.4 mg/dL — ABNORMAL LOW (ref 8.9–10.3)
Chloride: 101 mmol/L (ref 98–111)
Creatinine, Ser: 1.39 mg/dL — ABNORMAL HIGH (ref 0.61–1.24)
GFR, Estimated: 52 mL/min — ABNORMAL LOW (ref >60)
Glucose, Bld: 212 mg/dL — ABNORMAL HIGH (ref 70–99)
Potassium: 4.3 mmol/L (ref 3.5–5.1)
Sodium: 134 mmol/L — ABNORMAL LOW (ref 135–145)

## 2021-12-03 LAB — RETICULOCYTES
Immature Retic Fract: 31.1 % — ABNORMAL HIGH (ref 2.3–15.9)
RBC.: 2.79 MIL/uL — ABNORMAL LOW (ref 4.22–5.81)
Retic Count, Absolute: 63.3 10*3/uL (ref 19.0–186.0)
Retic Ct Pct: 2.3 % (ref 0.4–3.1)

## 2021-12-03 LAB — VITAMIN B12: Vitamin B-12: 885 pg/mL (ref 180–914)

## 2021-12-03 LAB — SEDIMENTATION RATE: Sed Rate: 133 mm/hr — ABNORMAL HIGH (ref 0–16)

## 2021-12-03 NOTE — Progress Notes (Addendum)
PROGRESS NOTE    DERRAL COLUCCI  VFI:[id number] DOB: 02/12/1941 DOA: 11/30/2021 PCP: [name], MD    Brief Narrative:   Terrence Rivera is a 81 y.o. male with past medical history significant for hypertension, malignant neoplasm of the bladder who presented to [facility] ED on 9/25 with left hand/wrist pain and swelling over the previous 2 days.  Denies any injury.  Patient reports decreased range of motion due to the swelling and pain.  Denies fever/chills, no shortness of breath, no chest pain, no nausea/vomiting/diarrhea.  In the ED, temperature 9 9.1 F, HR 101, RR 18, BP 173/98, SPO2 98% on room air.  WBC 9.6, hemoglobin 8.4, platelets 617.  Sodium 137, potassium 4.4, chloride 103, CO2 27, glucose 218, BUN 28, creatinine 1.61.  CRP 15.8.  ESR 133.  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  Orthopedics was consulted who recommended IR arthrocentesis.  TRH consulted for admission for concern of septic arthritis left wrist.  Assessment & Plan:   Infectious tenosynovitis, left wrist Patient presenting to the ED with 2-day history of progressive left wrist pain with decreased range of motion associated with edema.  Denies any injury. 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  Underwent IR arthrocentesis on 9/26.  Cell counts on joint aspiration with 5670 WBCs, neutrophil predominant; no crystals seen.  Seen by orthopedics, Dr. Doreatha Martin; believes this is not consistent with septic arthritis and recommended continue IV antibiotics, bracing anti-inflammatories and no indication to perform a formal I&D at this time. --Blood cultures x2: No growth x 2 days --Joint aspiration culture: No organisms on Gram stain, abundant WBC present, no growth x 3 days --ESR 133>133 --CRP 15.8>19.7>22.2 --Vancomycin, pharmacy consulted for dosing/monitoring --Ceftriaxone 2 g IV every 24 hours --prednisone 2m PO daily --Pain control with Tylenol/oxycodone/fentanyl --Continue to trend inflammatory markers  Acute renal failure Creatinine on admission 1.81, baseline 0.99. --Cr 1.81>>1.67>1.39 --Hold home lisinopril --IVF w/ NS at 758mh --Avoid nephrotoxins, renal cell medications --Repeat BMP in a.m.  Essential hypertension On lisinopril 10 mg p.o. daily at baseline.  Currently holding due to acute renal failure as above. --Hydralazine 2580mO q8h -- Continue monitor BP closely  Normocytic anemia Unclear etiology, MCV within normal limits.  Anemia panel with iron 28, TIBC 203, ferritin 203, folate 10.4, vitamin B12 885. -- Hemoglobin 7.2 this morning -- Repeat CBC in a.m.  DVT prophylaxis: SCDs Start: 12/01/21 0250    Code Status: Full Code Family Communication: Updated patient's spouse and grandson present at bedside this morning  Disposition Plan:  Level of care: Telemetry Status is: Inpatient Remains inpatient appropriate because: IV antibiotics    Consultants:  Orthopedics, Dr. HadDoreatha Martinrocedures:  IR left wrist joint aspiration, 9/26  Antimicrobials:  Vancomycin 9/25>> Ceftriaxone 9/25>>   Subjective: Patient seen examined bedside, resting comfortably.  Lying in bed.  Spouse present.   Video translator, NorConstance [id number] with translation.  Range  of motion and pain much improved today.  No other specific complaints at this time.  Hemoglobin down to 7.2, notably iron deficient on anemia panel will give IV iron today.  Patient hopeful he will be able to discharge home tomorrow.  No other specific questions or concerns at this time per patient or family.  Patient denies headache, no fever/chills/night sweats, no nausea/vomiting/diarrhea, no chest pain, no palpitations, no shortness of breath, no abdominal pain, no focal weakness, no fatigue, no cough/congestion, no paresthesias.  No acute events overnight per nursing staff.  Objective: Vitals:   12/02/21 1729 12/02/21 1950 12/03/21 0500 12/03/21 0521  BP: (!) 148/74 (!) 141/68  (!) 144/75  Pulse: 77 97  75  Resp: _0 Temp: 97.8 F (36.6 C) 97.8 F (36.6 C)  97.9 F (36.6 C)  TempSrc:    Oral  SpO2: 100% 99%  100%  Weight:   63 kg   Height:        Intake/Output Summary (Last 24 hours) at 12/03/2021 1349 Last data filed at 12/02/2021 1729 Gross per 24 hour  Intake 472 ml  Output --  Net 472 ml   Filed Weights   11/30/21 1529 12/02/21 0500 12/03/21 0500  Weight: 65.8 kg 61.9 kg 63 kg    Examination:  Physical Exam: GEN: NAD, alert and oriented x 3, wd/wn HEENT: NCAT, PERRL, EOMI, sclera clear, MMM PULM: CTAB w/o wheezes/crackles, normal respiratory effort, on room air CV: RRR w/o M/G/R GI: abd soft, NTND, NABS, no R/G/M MSK: Left wrist with tenderness to palpation both anterior/posterior aspect with slightly decreased range of motion both passive and active due to pain, notably edematous, neurovascularly intact otherwise, moves all extremities otherwise independently with grossly intact muscle strength. NEURO: CN II-XII intact, no focal deficits, sensation to light touch intact PSYCH: normal mood/affect Integumentary: Left wrist with edema/mild erythema, slight TTP, otherwise no other concerning  rashes/lesions/wounds     Data Reviewed: I have personally reviewed following labs and imaging studies  CBC: Recent Labs  Lab 11/30/21 2309 12/01/21 0545 12/03/21 0553  WBC 9.6 9.5 8.9  NEUTROABS  --  5.8  --   HGB 8.4* 7.4* 7.2*  HCT 27.6* 23.8* 23.9*  MCV 83.9 83.5 84.2  PLT 617* 531* 419*   Basic Metabolic Panel: Recent Labs  Lab 11/30/21 2309 12/01/21 0545 12/03/21 0553  NA 137 135 134*  K 4.4 4.0 4.3  CL 103 101 101  CO2 _1 GLUCOSE 218* 141* 212*  BUN 28* 26* 24*  CREATININE 1.61* 1.67* 1.39*  CALCIUM 8.5* 8.2* 8.4*  MG  --  2.0  --    GFR: Estimated Creatinine Clearance: 38.4 mL/min (A) (by C-G formula based on SCr of 1.39 mg/dL (H)). Liver Function Tests: Recent Labs  Lab 12/01/21 0545  AST 25  ALT 49*  ALKPHOS 90  BILITOT 0.6  PROT 6.9  ALBUMIN 2.3*   No results for input(s): "LIPASE", "AMYLASE" in the last 168 hours. No results for input(s): "AMMONIA" in the last 168 hours. Coagulation Profile: No results for input(s): "INR", "PROTIME" in the last 168 hours. Cardiac Enzymes: No results for input(s): "CKTOTAL", "CKMB", "CKMBINDEX", "TROPONINI" in the last 168 hours. BNP (last 3 results) No results for input(s): "PROBNP" in the last 8760 hours. HbA1C: No results for input(s): "HGBA1C" in the last 72 hours. CBG: No results for input(s): "GLUCAP" in the last 168 hours. Lipid Profile: No results for input(s): "CHOL", "HDL", "LDLCALC", "TRIG", "CHOLHDL", "LDLDIRECT"  in the last 72 hours. Thyroid Function Tests: No results for input(s): "TSH", "T4TOTAL", "FREET4", "T3FREE", "THYROIDAB" in the last 72 hours. Anemia Panel: Recent Labs    12/03/21 0553  VITAMINB12 885  FOLATE 10.4  FERRITIN 203  TIBC 203*  IRON 28*  RETICCTPCT 2.3   Sepsis Labs: No results for input(s): "PROCALCITON", "LATICACIDVEN" in the last 168 hours.  Recent Results (from the past 240 hour(s))  Culture, blood (Routine X 2) w Reflex to ID Panel     Status: None  (Preliminary result)   Collection Time: 12/01/21  2:00 AM   Specimen: BLOOD  Result Value Ref Range Status   Specimen Description   Final    BLOOD BLOOD RIGHT FOREARM Performed at [facility] [address], [place], [place] [address]    Special Requests   Final    BOTTLES DRAWN AEROBIC AND ANAEROBIC Blood Culture results may not be optimal due to an excessive volume of blood received in culture bottles Performed at [facility] [address] [place], [place] [address]    Culture   Final    NO GROWTH 2 DAYS Performed at [facility] [address], [place], [place] [address]    Report Status PENDING  Incomplete  Culture, blood (Routine X 2) w Reflex to ID Panel     Status: None (Preliminary result)   Collection Time: 12/01/21  2:05 AM   Specimen: BLOOD  Result Value Ref Range Status   Specimen Description   Final    BLOOD LEFT ANTECUBITAL Performed at [facility] [address] [place], [place] [address]    Special Requests   Final    BOTTLES DRAWN AEROBIC AND ANAEROBIC Blood Culture adequate volume Performed at [facility] [address], [place], [place] [address]    Culture   Final    NO GROWTH 2 DAYS Performed at [facility] [address] [place], [place] [address]    Report Status PENDING  Incomplete  Body fluid culture w Gram Stain     Status: None (Preliminary result)   Collection Time: 12/01/21  3:41 PM   Specimen: Joint, Left Wrist; Synovial Fluid  Result Value Ref Range Status   Specimen Description   Final    SYNOVIAL Performed at [facility], [place] [address], [place], [place] [address]    Special Requests   Final    NONE JOINT, LEFT WRIST Performed at [facility] [address], [place], [place] [address]    Gram Stain   Final    ABUNDANT WBC PRESENT, PREDOMINANTLY PMN NO ORGANISMS SEEN    Culture   Final    NO  GROWTH 2 DAYS Performed at [facility], [place] [address] [place],  [address]    Report Status PENDING  Incomplete         Radiology Studies: DG FLUORO GUIDED NEEDLE PLC ASPIRATION/INJECTION LOC  Result Date: 12/01/2021 CLINICAL DATA:  Left wrist pain and swelling, effusion EXAM: LEFT WRIST ASPIRATION UNDER FLUOROSCOPY COMPARISON:  Same day MR. FLUOROSCOPY: Radiation Exposure Index (as provided by the fluoroscopic device): 0.40 mGy Kerma PROCEDURE: The risks (including hemorrhage, introduction of infection, and non diagnosis), benefits, and alternatives to fluoroscopically guided radiocarpal joint aspiration were discussed with the patient. He understood and elected to undergo the procedure. Standard time-out was employed. Overlying skin prepped with Betadine, draped in the usual sterile fashion, and infiltrated locally with  1% Lidocaine. An 18g needle advanced to the radiocarpal joint under fluoroscopic guidance and 2cc purulent material was aspirated. Needle removed and site bandaged. No immediate complication. IMPRESSION: Technically successful wrist aspiration yielding purulent fluid as above. Read and performed by: Alexandria Lodge, PA-C, supervised by Van Clines, MD Electronically Signed   By: Van Clines M.D.   On: 12/01/2021 16:09        Scheduled Meds:  hydrALAZINE  25 mg Oral Q8H   predniSONE  40 mg Oral Q breakfast   Continuous Infusions:  sodium chloride 75 mL/hr at 12/03/21 0634   cefTRIAXone (ROCEPHIN)  IV 2 g (12/03/21 0354)   vancomycin 750 mg (12/03/21 0521)     LOS: 1 day    Time spent: 48 minutes spent on chart review, discussion with nursing staff, consultants, updating family and interview/physical exam; more than 50% of that time was spent in counseling and/or coordination of care.    Eric J [place] DO [facility] Available via Epic secure chat 7am-7pm After these hours, please refer to coverage provider listed on  [email or web address] 12/03/2021, 1:49 PM

## 2021-12-03 NOTE — Progress Notes (Signed)
  Transition of Care [facility]) Screening Note   Patient Details  Name: Terrence Rivera Date of Birth: [birth year]   Transition of Care [facility]) CM/SW Contact:    Vassie Moselle, LCSW Phone Number: 12/03/2021, 2:49 PM    Transition of Care Department [facility]) has reviewed patient and no TOC needs have been identified at this time. We will continue to monitor patient advancement through interdisciplinary progression rounds. If new patient transition needs arise, please place a TOC consult.

## 2021-12-04 ENCOUNTER — Encounter: Payer: Self-pay | Admitting: Oncology

## 2021-12-04 ENCOUNTER — Other Ambulatory Visit (HOSPITAL_COMMUNITY): Payer: Self-pay

## 2021-12-04 DIAGNOSIS — M19032 Primary osteoarthritis, left wrist: Secondary | ICD-10-CM | POA: Diagnosis not present

## 2021-12-04 LAB — BASIC METABOLIC PANEL
Anion gap: 10 (ref 5–15)
BUN: 25 mg/dL — ABNORMAL HIGH (ref 8–23)
CO2: 21 mmol/L — ABNORMAL LOW (ref 22–32)
Calcium: 8.3 mg/dL — ABNORMAL LOW (ref 8.9–10.3)
Chloride: 104 mmol/L (ref 98–111)
Creatinine, Ser: 1.28 mg/dL — ABNORMAL HIGH (ref 0.61–1.24)
GFR, Estimated: 57 mL/min — ABNORMAL LOW (ref >60)
Glucose, Bld: 271 mg/dL — ABNORMAL HIGH (ref 70–99)
Potassium: 4.3 mmol/L (ref 3.5–5.1)
Sodium: 135 mmol/L (ref 135–145)

## 2021-12-04 LAB — CBC
HCT: 26.2 % — ABNORMAL LOW (ref 39.0–52.0)
Hemoglobin: 7.8 g/dL — ABNORMAL LOW (ref 13.0–17.0)
MCH: 26.1 pg (ref 26.0–34.0)
MCHC: 29.8 g/dL — ABNORMAL LOW (ref 30.0–36.0)
MCV: 87.6 fL (ref 80.0–100.0)
Platelets: 585 10*3/uL — ABNORMAL HIGH (ref 150–400)
RBC: 2.99 MIL/uL — ABNORMAL LOW (ref 4.22–5.81)
RDW: 17.9 % — ABNORMAL HIGH (ref 11.5–15.5)
WBC: 15.7 10*3/uL — ABNORMAL HIGH (ref 4.0–10.5)
nRBC: 0 % (ref 0.0–0.2)

## 2021-12-04 LAB — C-REACTIVE PROTEIN: CRP: 14 mg/dL — ABNORMAL HIGH

## 2021-12-04 LAB — SEDIMENTATION RATE: Sed Rate: 115 mm/h — ABNORMAL HIGH (ref 0–16)

## 2021-12-04 MED ORDER — CEFDINIR 300 MG PO CAPS
300.0000 mg | ORAL_CAPSULE | Freq: Two times a day (BID) | ORAL | 0 refills | Status: AC
  Filled 2021-12-04: qty 24, 12d supply, fill #0

## 2021-12-04 MED ORDER — PREDNISONE 10 MG PO TABS
ORAL_TABLET | ORAL | 0 refills | Status: AC
  Filled 2021-12-04: qty 28, 15d supply, fill #0

## 2021-12-04 MED ORDER — LINEZOLID 600 MG PO TABS
600.0000 mg | ORAL_TABLET | Freq: Two times a day (BID) | ORAL | 0 refills | Status: AC
  Filled 2021-12-04: qty 24, 12d supply, fill #0

## 2021-12-04 NOTE — Discharge Summary (Signed)
Physician Discharge Summary  Terrence Rivera VOH:[id number] DOB: Sep 17, 1940 DOA: 11/30/2021  PCP: [name], MD  Admit date: 11/30/2021 Discharge date: 12/04/2021  Admitted From: Home Disposition: Home  Recommendations for Outpatient Follow-up:  Follow up with PCP in 1-2 weeks Follow-up with orthopedics, Dr. Doreatha Martin 2 weeks Continue antibiotics with Zyvox and cefdinir on discharge to complete total course of 14 days for infectious tenosynovitis Continue prednisone taper on discharge Recommend BMP in 1 week to reassess renal function Follow-up finalized blood and wrist aspiration cultures that were pending finalization at time of discharge  Home Health: No Equipment/Devices: None  Discharge Condition: Stay able CODE STATUS: Full code Diet recommendation: Heart healthy diet  History of present illness:  Terrence Rivera is a 81 y.o. male with past medical history significant for hypertension, malignant neoplasm of the bladder who presented to [facility] ED on 9/25 with left hand/wrist pain and swelling over the previous 2 days.  Denies any injury.  Patient reports decreased range of motion due to the swelling and pain.  Denies fever/chills, no shortness of breath, no chest pain, no nausea/vomiting/diarrhea.   In the ED, temperature 9 9.1 F, HR 101, RR 18, BP 173/98, SPO2 98% on room air.  WBC 9.6, hemoglobin 8.4, platelets 617.  Sodium 137, potassium 4.4, chloride 103, CO2 27, glucose 218, BUN 28, creatinine 1.61.  CRP 15.8.  ESR 133.  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  Orthopedics was consulted who recommended IR arthrocentesis.  TRH consulted for admission for concern of septic arthritis left wrist.  Hospital course:  Infectious tenosynovitis, left wrist Patient presenting to the ED with 2-day history of progressive left wrist pain with decreased range of motion associated with edema.  Denies any injury. 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  Underwent IR arthrocentesis on 9/26.  Cell counts on joint aspiration with 5670 WBCs, neutrophil predominant; no crystals seen.  Seen by orthopedics, Dr. Doreatha Martin; believes this is not consistent with septic arthritis and recommended continue IV antibiotics, bracing anti-inflammatories and no indication to perform a formal I&D at this time.  Blood cultures x2 with no growth during hospitalization.  Joint aspiration culture with no organisms noted on Gram stain with abundant WBCs with no growth for 3 days.  Patient was started on vancomycin and ceftriaxone with improvement of his symptoms and range of motion and will discharge on Zyvox and cefdinir to complete 14-day antibiotic course as well as prednisone taper.  Will need outpatient follow-up with orthopedics 2-3 weeks.   Acute renal failure Creatinine on admission 1.81, baseline 0.99.  Patient was supported with IV fluids with improvement of creatinine to 1.28 at time of discharge.  Recommend repeat BMP 1 week.  Essential hypertension On lisinopril 10 mg p.o. daily at baseline.    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  Anemia panel with  iron 28, TIBC 203,  ferritin 203, folate 10.4, vitamin B12 885.  Hemoglobin 7.4 at time of discharge.  Outpatient follow-up PCP.  Discharge Diagnoses:  Principal Problem:   Arthropathy of left wrist    Discharge Instructions  Discharge Instructions     Call MD for:  difficulty breathing, headache or visual disturbances   Complete by: As directed    Call MD for:  extreme fatigue   Complete by: As directed    Call MD for:  persistant dizziness or light-headedness   Complete by: As directed    Call MD for:  persistant nausea and vomiting   Complete by: As directed    Call MD for:  severe uncontrolled pain   Complete by: As directed    Call MD for:  temperature >100.4   Complete by: As directed    Diet - low sodium heart healthy   Complete by: As directed    Increase activity slowly   Complete by: As directed       Allergies as of 12/04/2021   No Known Allergies      Medication List     TAKE these medications    cefdinir 300 MG capsule Commonly known as: OMNICEF Take 1 capsule (300 mg total) by mouth 2 (two) times daily for 12 days.   lidocaine-prilocaine cream Commonly known as: EMLA Apply 1 Application topically as needed.   linezolid 600 MG tablet Commonly known as: Zyvox Take 1 tablet (600 mg total) by mouth 2 (two) times daily for 12 days.   lisinopril 20 MG tablet Commonly known as: ZESTRIL Take 1 tablet (20 mg total) by mouth daily.   oxyCODONE 5 MG immediate release tablet Commonly known as: Oxy IR/ROXICODONE Take 1 tablet (5 mg total) by mouth every 4 (four) hours as needed for moderate pain.   predniSONE 10 MG tablet Commonly known as: DELTASONE Take 4 tablets (40 mg total) by mouth daily for 2 days, THEN 3 tablets (30 mg total) daily for 3 days, THEN 2 tablets (20 mg total) daily for 3 days, THEN 1 tablet (10 mg total) daily for 3 days, THEN 0.5 tablets (5 mg total) daily for 4 days. Start taking on: December 04, 2021   prochlorperazine 10 MG  tablet Commonly known as: COMPAZINE Take 1 tablet (10 mg total) by mouth every 6 (six) hours as needed for nausea or vomiting.        Follow-up Information     [name], MD. Schedule an appointment as soon as possible for a visit in 1 week(s).   Specialty: Family Medicine Contact information: [place] [address] [phone]         Shona Needles, MD. Schedule an appointment as soon as possible for a visit in 2 week(s).   Specialty: Orthopedic Surgery Contact information: [place] [address] [phone]                No Known Allergies  Consultations: Orthopedics, Dr. Doreatha Martin   Procedures/Studies: DG FLUORO GUIDED NEEDLE PLC ASPIRATION/INJECTION LOC  Result Date: 12/01/2021 CLINICAL DATA:  Left wrist pain and swelling, effusion EXAM: LEFT WRIST ASPIRATION UNDER FLUOROSCOPY COMPARISON:  Same day MR. FLUOROSCOPY: Radiation Exposure Index (as provided by the fluoroscopic device): 0.40 mGy Kerma PROCEDURE: The risks (including hemorrhage, introduction of infection, and non diagnosis), benefits, and alternatives to fluoroscopically guided radiocarpal joint aspiration were discussed with the patient. He understood and elected to undergo the procedure. Standard time-out was employed. Overlying skin  prepped with Betadine, draped in the usual sterile fashion, and infiltrated locally with 1% Lidocaine. An 18g needle advanced to the radiocarpal joint under fluoroscopic guidance and 2cc purulent material was aspirated. Needle removed and site bandaged. No immediate complication. IMPRESSION: Technically successful wrist aspiration yielding purulent fluid as above. Read and performed by: Alexandria Lodge, PA-C, supervised by Van Clines, MD Electronically Signed   By: Van Clines M.D.   On: 12/01/2021 16:09   MR WRIST LEFT W WO CONTRAST  Result Date: 12/01/2021 CLINICAL DATA:  Left hand pain, swelling, and redness  for the past 2 days. No injury. EXAM: MR OF THE LEFT WRIST WITHOUT AND WITH CONTRAST TECHNIQUE: Multiplanar multisequence MR imaging of the left wrist was performed both before and after the administration of intravenous contrast. CONTRAST:  6 mL Vueway intravenous contrast. COMPARISON:  Left hand and wrist x-rays from yesterday. FINDINGS: Ligaments: Chronically torn scapholunate and lunotriquetral ligaments. Triangular fibrocartilage: Full-thickness tear of the ulnar aspect of the TFCC. Tendons: Intact flexor and extensor compartment tendons. Moderate amount of fluid in the second and third extensor compartment tendon sheaths with associated synovial enhancement. Small amount of fluid within the flexor tendon sheaths with diffuse synovial enhancement proximal to the carpal tunnel. Carpal tunnel/median nerve: Normal carpal tunnel. Normal median nerve. Guyon's canal: Normal. Joint/cartilage: Large midcarpal, radiocarpal, and distal radioulnar joint effusions with thick synovial enhancement. 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 Moderate wrist osteoarthritis. Large degenerative cysts in the lunate and distal ulna. Small erosions of the distal radius articular surface. No suspicious bone lesion. Other: Diffuse soft tissue swelling.  No fluid collection. IMPRESSION: 1. Constellation of findings could reflect septic arthritis or inflammatory arthropathy. Large wrist effusion is amenable to arthrocentesis. 2. Moderate second and third extensor compartment tenosynovitis. Mild flexor tenosynovitis. Again, findings could reflect inflammatory arthropathy or infectious tenosynovitis. 3. 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 4. Moderate post-traumatic wrist osteoarthritis. 5. Full-thickness tear of the TFCC. Electronically Signed   By: Titus Dubin M.D.   On: 12/01/2021 12:06   ECHOCARDIOGRAM COMPLETE  Result Date: 12/01/2021    ECHOCARDIOGRAM REPORT   Patient  Name:   Terrence Rivera Date of Exam: 12/01/2021 Medical Rec #:  [id number]             Height:       68.0 in Accession #:    [id number]            Weight:       145.0 lb Date of Birth:  08/03/1940              BSA:          1.783 m Patient Age:    26 years              BP:           160/84 mmHg Patient Gender: M                     HR:           86 bpm. Exam Location:  Inpatient Procedure: 2D Echo, Cardiac Doppler and Color Doppler Indications:    Murmur  History:        Patient has no prior history of Echocardiogram examinations.                 Risk Factors:Hypertension.  Sonographer:    Jefferey Pica Referring Phys: [id number] [place]  1. Left ventricular  ejection fraction, by estimation, is 55 to 60%. The left ventricle has normal function. The left ventricle has no regional wall motion abnormalities. There is mild asymmetric left ventricular hypertrophy of the basal-septal segment. Left ventricular diastolic parameters are consistent with Grade II diastolic dysfunction (pseudonormalization).  2. Right ventricular systolic function is normal. The right ventricular size is normal. There is severely elevated pulmonary artery systolic pressure. The estimated right ventricular systolic pressure is 40.9 mmHg.  3. Left atrial size was mildly dilated.  4. 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 There is eccentric mitral regurgitation with horizontal color splay seen in the apical views. 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 No evidence of mitral stenosis. Pulmonary veins not well assessed.  5. The tricuspid valve is normal. Tricuspid valve regurgitation is moderate to severe. Hepatic Doppler signal not performed.  6. The aortic valve is tricuspid. There is mild thickening of the aortic valve. Aortic valve regurgitation is not visualized. No aortic stenosis is present. Comparison(s): No prior Echocardiogram. Conclusion(s)/Recommendation(s): Consider TEE for further valve  assessment if clinically indicated. FINDINGS  Left Ventricle: Left ventricular ejection fraction, by estimation, is 55 to 60%. The left ventricle has normal function. The left ventricle has no regional wall motion abnormalities. The left ventricular internal cavity size was normal in size. There is  mild asymmetric left ventricular hypertrophy of the basal-septal segment. Left ventricular diastolic parameters are consistent with Grade II diastolic dysfunction (pseudonormalization). Right Ventricle: The right ventricular size is normal. No increase in right ventricular wall thickness. Right ventricular systolic function is normal. There is severely elevated pulmonary artery systolic pressure. The tricuspid regurgitant velocity is 3.87 m/s, and with an assumed right atrial pressure of 3 mmHg, the estimated right ventricular systolic pressure is 73.5 mmHg. Left Atrium: Left atrial size was mildly dilated. Right Atrium: Right atrial size was normal in size. Pericardium: There is no evidence of pericardial effusion. Mitral Valve: The mitral valve is abnormal. 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 No evidence of mitral valve stenosis. Tricuspid Valve: Bileaflet prolapse. The tricuspid valve is normal in structure. Tricuspid valve regurgitation is moderate to severe. Aortic Valve: The aortic valve is tricuspid. There is mild thickening of the aortic valve. Aortic valve regurgitation is not visualized. No aortic stenosis is present. Aortic valve peak gradient measures 5.7 mmHg. Pulmonic Valve: The pulmonic valve was normal in structure. Pulmonic valve regurgitation is trivial. No evidence of pulmonic stenosis. Aorta: The aortic root and ascending aorta are structurally normal, with no evidence of dilitation. IAS/Shunts: The atrial septum is grossly normal.  LEFT VENTRICLE PLAX 2D LVIDd:         5.50 cm   Diastology LVIDs:         3.20 cm   LV e' medial:    6.97 cm/s LV PW:         1.00 cm   LV E/e' medial:  18.5 LV  IVS:        1.10 cm   LV e' lateral:   8.16 cm/s LVOT diam:     1.90 cm   LV E/e' lateral: 15.8 LV SV:         50 LV SV Index:   28 LVOT Area:     2.84 cm  RIGHT VENTRICLE             IVC RV Basal diam:  2.80 cm     IVC diam: 1.40 cm RV S prime:     20.00 cm/s TAPSE (M-mode): 1.8  cm LEFT ATRIUM             Index        RIGHT ATRIUM           Index LA diam:        3.90 cm 2.19 cm/m   RA Area:     14.70 cm LA Vol (A2C):   67.8 ml 38.03 ml/m  RA Volume:   36.90 ml  20.70 ml/m LA Vol (A4C):   59.2 ml 33.21 ml/m LA Biplane Vol: 64.1 ml 35.96 ml/m  AORTIC VALVE                 PULMONIC VALVE AV Area (Vmax): 2.26 cm     PV Vmax:       0.79 m/s AV Vmax:        119.50 cm/s  PV Peak grad:  2.5 mmHg AV Peak Grad:   5.7 mmHg LVOT Vmax:      95.30 cm/s LVOT Vmean:     57.600 cm/s LVOT VTI:       0.178 m  AORTA Ao Root diam: 2.90 cm Ao Asc diam:  3.40 cm MITRAL VALVE                TRICUSPID VALVE MV Area (PHT): 3.85 cm     TR Peak grad:   59.9 mmHg MV Decel Time: 197 msec     TR Vmax:        387.00 cm/s MR Peak grad: 114.5 mmHg MR Vmax:      535.00 cm/s   SHUNTS MV E velocity: 129.00 cm/s  Systemic VTI:  0.18 m MV A velocity: 108.00 cm/s  Systemic Diam: 1.90 cm MV E/A ratio:  1.19 Rudean Haskell MD Electronically signed by Rudean Haskell MD Signature Date/Time: 12/01/2021/10:52:55 AM    Final    DG Hand Complete Left  Result Date: 11/30/2021 CLINICAL DATA:  Left hand swelling EXAM: LEFT HAND - COMPLETE 3 VIEW; LEFT WRIST - COMPLETE 3 VIEW COMPARISON:  None Available. FINDINGS: No evidence of acute fracture or dislocation. Chronic nonunion of scaphoid fracture. Severe degenerative changes of the radiocarpal joint and triscaphe joint. Mild soft tissue swelling about the wrist. 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 Electronically Signed   By: Yetta Glassman M.D.   On: 11/30/2021 17:04   DG Wrist Complete Left  Result Date: 11/30/2021 CLINICAL  DATA:  Left hand swelling EXAM: LEFT HAND - COMPLETE 3 VIEW; LEFT WRIST - COMPLETE 3 VIEW COMPARISON:  None Available. FINDINGS: No evidence of acute fracture or dislocation. Chronic nonunion of scaphoid fracture. Severe degenerative changes of the radiocarpal joint and triscaphe joint. Mild soft tissue swelling about the wrist. 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 Electronically Signed   By: Yetta Glassman M.D.   On: 11/30/2021 17:04   IR IMAGING GUIDED PORT INSERTION  Result Date: 11/23/2021 INDICATION: 81 year old male with a history of malignant neoplasm of the urinary bladder. He requires durable venous access and presents for port catheter placement. EXAM: IMPLANTED PORT A CATH PLACEMENT WITH ULTRASOUND AND FLUOROSCOPIC GUIDANCE MEDICATIONS: None. ANESTHESIA/SEDATION: Versed 1.5 mg IV; Fentanyl 75 mcg IV; Moderate Sedation Time:  16 minutes The patient's vital signs and level of consciousness were continuously monitored during the procedure by the interventional radiology nurse under my direct supervision. FLUOROSCOPY: Radiation exposure index: 4 mGy reference air kerma COMPLICATIONS: None immediate. PROCEDURE: The right neck and chest  was prepped with chlorhexidine, and draped in the usual sterile fashion using maximum barrier technique (cap and mask, sterile gown, sterile gloves, large sterile sheet, hand hygiene and cutaneous antiseptic). Local anesthesia was attained by infiltration with 1% lidocaine with epinephrine. Ultrasound demonstrated patency of the right internal jugular vein, and this was documented with an image. Under real-time ultrasound guidance, this vein was accessed with a 21 gauge micropuncture needle and image documentation was performed. A small dermatotomy was made at the access site with an 11 scalpel. A 0.018" wire was advanced into the SVC and the access needle exchanged for a 64F micropuncture vascular sheath. The 0.018"  wire was then removed and a 0.035" wire advanced into the IVC. An appropriate location for the subcutaneous reservoir was selected below the clavicle and an incision was made through the skin and underlying soft tissues. The subcutaneous tissues were then dissected using a combination of blunt and sharp surgical technique and a pocket was formed. A single lumen power injectable portacatheter was then tunneled through the subcutaneous tissues from the pocket to the dermatotomy and the port reservoir placed within the subcutaneous pocket. The venous access site was then serially dilated and a peel away vascular sheath placed over the wire. The wire was removed and the port catheter advanced into position under fluoroscopic guidance. The catheter tip is positioned in the superior cavoatrial junction. This was documented with a spot image. The portacatheter was then tested and found to flush and aspirate well. The port was flushed with saline followed by 100 units/mL heparinized saline. The pocket was then closed in two layers using first subdermal inverted interrupted absorbable sutures followed by a running subcuticular suture. The epidermis was then sealed with Dermabond. The dermatotomy at the venous access site was also closed with Dermabond. IMPRESSION: Successful placement of a right IJ approach Power Port with ultrasound and fluoroscopic guidance. The catheter is ready for use. Electronically Signed   By: Jacqulynn Cadet M.D.   On: 11/23/2021 15:41     Subjective: Patient seen examined at bedside, resting comfortably.  Spouse present.  Aided in interpretation with video interpreter Leanna Sato [phone].  Patient's pain relatively resolved to wrist with much improved range of motion.  Feels ready for discharge home.  Discussed will continue antibiotics, prednisone taper and needs close follow-up with orthopedics after discharge.  No other specific questions or concerns at this time.  Denies headache, no dizziness,  no chest pain, no palpitations, no shortness of breath, no abdominal pain, no fever/chills/night sweats, no nausea/vomiting/diarrhea, no focal weakness, no fatigue, no cough/congestion, no paresthesias.  No acute events overnight per nursing staff.  Discharge Exam: Vitals:   12/03/21 2050 12/04/21 0547  BP: (!) 162/89 (!) 153/92  Pulse: 88 81  Resp: 14 16  Temp: (!) 97.5 F (36.4 C) 97.7 F (36.5 C)  SpO2: 100% 98%   Vitals:   12/03/21 0521 12/03/21 2050 12/04/21 0500 12/04/21 0547  BP: (!) 144/75 (!) 162/89  (!) 153/92  Pulse: 75 88  81  Resp: _0 Temp: 97.9 F (36.6 C) (!) 97.5 F (36.4 C)  97.7 F (36.5 C)  TempSrc: Oral Oral  Oral  SpO2: 100% 100%  98%  Weight:   68.9 kg   Height:        Physical Exam: GEN: NAD, alert and oriented x 3, wd/wn HEENT: NCAT, PERRL, EOMI, sclera clear, MMM PULM: CTAB w/o wheezes/crackles, normal respiratory effort CV: RRR w/o M/G/R GI: abd soft, NTND,  NABS, no R/G/M MSK: Left wrist with mild tenderness to palpation both anterior/posterior aspect with improved passive/active range of motion, edema much improved and neurovascularly intact, moves all other extremities independently without issue.   NEURO: CN II-XII intact, no focal deficits, sensation to light touch intact PSYCH: normal mood/affect Integumentary: Left wrist with mild edema, erythema now resolved with mild tenderness to palpation, no other concerning rashes/lesions/wounds noted on exposed skin surfaces.     Left wrist as depicted on 11/30/2021  The results of significant diagnostics from this hospitalization (including imaging, microbiology, ancillary and laboratory) are listed below for reference.     Microbiology: Recent Results (from the past 240 hour(s))  Culture, blood (Routine X 2) w Reflex to ID Panel     Status: None (Preliminary result)   Collection Time: 12/01/21  2:00 AM   Specimen: BLOOD  Result Value Ref Range Status   Specimen Description   Final     BLOOD BLOOD RIGHT FOREARM Performed at [facility] [address] [place], [place] [address]    Special Requests   Final    BOTTLES DRAWN AEROBIC AND ANAEROBIC Blood Culture results may not be optimal due to an excessive volume of blood received in culture bottles Performed at [facility] [address], [place], [place] [address]    Culture   Final    NO GROWTH 3 DAYS Performed at [facility], [place] [address], [place], [place] [address]    Report Status PENDING  Incomplete  Culture, blood (Routine X 2) w Reflex to ID Panel     Status: None (Preliminary result)   Collection Time: 12/01/21  2:05 AM   Specimen: BLOOD  Result Value Ref Range Status   Specimen Description   Final    BLOOD LEFT ANTECUBITAL Performed at [facility] [address], [place], [place] [address]    Special Requests   Final    BOTTLES DRAWN AEROBIC AND ANAEROBIC Blood Culture adequate volume Performed at [facility] [address], [place], [place] [address]    Culture   Final    NO GROWTH 3 DAYS Performed at [facility], [place] [address], [place], [place] [address]    Report Status PENDING  Incomplete  Body fluid culture w Gram Stain     Status: None (Preliminary result)   Collection Time: 12/01/21  3:41 PM   Specimen: Joint, Left Wrist; Synovial Fluid  Result Value Ref Range Status   Specimen Description   Final    SYNOVIAL Performed at [facility], [place] [address] [place], [place] [address]    Special Requests   Final    NONE JOINT, LEFT WRIST Performed at [facility] [address], [place],  [address]    Gram Stain   Final    ABUNDANT WBC PRESENT, PREDOMINANTLY PMN NO ORGANISMS SEEN    Culture   Final    NO GROWTH 3 DAYS Performed at [facility], [place] [address] [place],  [address]    Report Status PENDING  Incomplete     Labs: BNP  (last 3 results) No results for input(s): "BNP" in the last 8760 hours. Basic Metabolic Panel: Recent Labs  Lab 11/30/21 2309 12/01/21 0545 12/03/21 0553 12/04/21 0549  NA 137 135 134* 135  K 4.4 4.0 4.3 4.3  CL 103 101 101 104  CO2 _0 21*  GLUCOSE 218* 141* 212*  271*  BUN 28* 26* 24* 25*  CREATININE 1.61* 1.67* 1.39* 1.28*  CALCIUM 8.5* 8.2* 8.4* 8.3*  MG  --  2.0  --   --    Liver Function Tests: Recent Labs  Lab 12/01/21 0545  AST 25  ALT 49*  ALKPHOS 90  BILITOT 0.6  PROT 6.9  ALBUMIN 2.3*   No results for input(s): "LIPASE", "AMYLASE" in the last 168 hours. No results for input(s): "AMMONIA" in the last 168 hours. CBC: Recent Labs  Lab 11/30/21 2309 12/01/21 0545 12/03/21 0553 12/04/21 0549  WBC 9.6 9.5 8.9 15.7*  NEUTROABS  --  5.8  --   --   HGB 8.4* 7.4* 7.2* 7.8*  HCT 27.6* 23.8* 23.9* 26.2*  MCV 83.9 83.5 84.2 87.6  PLT 617* 531* 511* 585*   Cardiac Enzymes: No results for input(s): "CKTOTAL", "CKMB", "CKMBINDEX", "TROPONINI" in the last 168 hours. BNP: Invalid input(s): "POCBNP" CBG: No results for input(s): "GLUCAP" in the last 168 hours. D-Dimer No results for input(s): "DDIMER" in the last 72 hours. Hgb A1c No results for input(s): "HGBA1C" in the last 72 hours. Lipid Profile No results for input(s): "CHOL", "HDL", "LDLCALC", "TRIG", "CHOLHDL", "LDLDIRECT" in the last 72 hours. Thyroid function studies No results for input(s): "TSH", "T4TOTAL", "T3FREE", "THYROIDAB" in the last 72 hours.  Invalid input(s): "FREET3" Anemia work up Recent Labs    12/03/21 0553  VITAMINB12 885  FOLATE 10.4  FERRITIN 203  TIBC 203*  IRON 28*  RETICCTPCT 2.3   Urinalysis No results found for: "COLORURINE", "APPEARANCEUR", "LABSPEC", "PHURINE", "GLUCOSEU", "HGBUR", "BILIRUBINUR", "KETONESUR", "PROTEINUR", "UROBILINOGEN", "NITRITE", "LEUKOCYTESUR" Sepsis Labs Recent Labs  Lab 11/30/21 2309 12/01/21 0545 12/03/21 0553 12/04/21 0549  WBC 9.6  9.5 8.9 15.7*   Microbiology Recent Results (from the past 240 hour(s))  Culture, blood (Routine X 2) w Reflex to ID Panel     Status: None (Preliminary result)   Collection Time: 12/01/21  2:00 AM   Specimen: BLOOD  Result Value Ref Range Status   Specimen Description   Final    BLOOD BLOOD RIGHT FOREARM Performed at [facility], [place] [address] [place], [place] [address]    Special Requests   Final    BOTTLES DRAWN AEROBIC AND ANAEROBIC Blood Culture results may not be optimal due to an excessive volume of blood received in culture bottles Performed at [facility] [address], [place], [place] [address]    Culture   Final    NO GROWTH 3 DAYS Performed at [facility], [place] [address] [place], [place] [address]    Report Status PENDING  Incomplete  Culture, blood (Routine X 2) w Reflex to ID Panel     Status: None (Preliminary result)   Collection Time: 12/01/21  2:05 AM   Specimen: BLOOD  Result Value Ref Range Status   Specimen Description   Final    BLOOD LEFT ANTECUBITAL Performed at [facility] [address], [place], [place] [address]    Special Requests   Final    BOTTLES DRAWN AEROBIC AND ANAEROBIC Blood Culture adequate volume Performed at [facility] [address] [place], [place] [address]    Culture   Final    NO GROWTH 3 DAYS Performed at [facility], [place] [address] [place], [place] [address]    Report Status PENDING  Incomplete  Body fluid culture w Gram Stain     Status: None (Preliminary result)  Collection Time: 12/01/21  3:41 PM   Specimen: Joint, Left Wrist; Synovial Fluid  Result Value Ref Range Status   Specimen Description   Final    SYNOVIAL Performed at [facility] [address], [place], [place] [address]    Special Requests   Final    NONE JOINT, LEFT WRIST Performed at [facility] [address], [place], [place] [address]    Gram Stain   Final    ABUNDANT WBC PRESENT, PREDOMINANTLY PMN NO ORGANISMS SEEN    Culture   Final    NO GROWTH 3 DAYS Performed at [facility], [place] [address], [place], [place] [address]    Report Status PENDING  Incomplete     Time coordinating discharge: Over 30 minutes  SIGNED:   Jaben Benegas J [place] DO  [facility] 12/04/2021, 10:44 AM

## 2021-12-04 NOTE — Progress Notes (Signed)
Mobility Specialist Cancellation/Refusal Note:    12/04/21 1019  Mobility  Activity Refused mobility     Reason for Cancellation/Refusal: Pt declined mobility at this time. Pt sleeping at this time. Will check back as schedule permits.     [facility]

## 2021-12-04 NOTE — TOC Transition Note (Signed)
Transition of Care [facility]) - CM/SW Discharge Note   Patient Details  Name: KACY CONELY MRN: [id number] Date of Birth: [birth year]  Transition of Care [facility] CM/SW Contact:  Leeroy Cha, RN Phone Number: 12/04/2021, 11:03 AM   Clinical Narrative:    [id number]/patient discharged to return home.  Chart reviewed for TOC needs.  None found.  Patient self care.   Final next level of care: Home/Self Care Barriers to Discharge: Barriers Resolved   Patient Goals and CMS Choice Patient states their goals for this hospitalization and ongoing recovery are:: to go home CMS Medicare.gov Compare Post Acute Care list provided to:: Patient Choice offered to / list presented to : Patient  Discharge Placement                       Discharge Plan and Services                                     Social Determinants of Health (SDOH) Interventions     Readmission Risk Interventions   Row Labels 12/03/2021    2:49 PM  Readmission Risk Prevention Plan   Section Header. No data exists in this row.   Transportation Screening   Complete  PCP or Specialist Appt within 5-7 Days   Complete  Home Care Screening   Complete  Medication Review (RN CM)   Complete

## 2021-12-04 NOTE — Plan of Care (Signed)

## 2021-12-05 LAB — BODY FLUID CULTURE W GRAM STAIN: Culture: NO GROWTH

## 2021-12-06 LAB — CULTURE, BLOOD (ROUTINE X 2)
Culture: NO GROWTH
Culture: NO GROWTH
Special Requests: ADEQUATE

## 2021-12-07 ENCOUNTER — Other Ambulatory Visit: Payer: Self-pay

## 2021-12-10 ENCOUNTER — Inpatient Hospital Stay: Payer: Medicare (Managed Care)

## 2021-12-17 ENCOUNTER — Encounter (HOSPITAL_COMMUNITY): Payer: Self-pay | Admitting: *Deleted

## 2021-12-17 ENCOUNTER — Other Ambulatory Visit: Payer: Self-pay

## 2021-12-17 ENCOUNTER — Emergency Department (HOSPITAL_COMMUNITY)
Admission: EM | Admit: 2021-12-17 | Discharge: 2021-12-17 | Disposition: A | Discharge status: Home / Self Care | Payer: Medicare (Managed Care) | Attending: Emergency Medicine | Admitting: Emergency Medicine

## 2021-12-17 DIAGNOSIS — N481 Balanitis: Secondary | ICD-10-CM | POA: Diagnosis present

## 2021-12-17 LAB — URINALYSIS, ROUTINE W REFLEX MICROSCOPIC
Bacteria, UA: NONE SEEN
Bilirubin Urine: NEGATIVE
Glucose, UA: 150 mg/dL — AB
Ketones, ur: NEGATIVE mg/dL
Nitrite: NEGATIVE
Protein, ur: 100 mg/dL — AB
RBC / HPF: >50 RBC/hpf — ABNORMAL HIGH (ref 0–5)
Specific Gravity, Urine: 1.014 (ref 1.005–1.030)
WBC, UA: >50 WBC/hpf — ABNORMAL HIGH (ref 0–5)
pH: 6 (ref 5.0–8.0)

## 2021-12-17 MED ORDER — FLUCONAZOLE 150 MG PO TABS
150.0000 mg | ORAL_TABLET | Freq: Once | ORAL | Status: AC
  Administered 2021-12-17: 150 mg via ORAL
  Filled 2021-12-17: qty 1

## 2021-12-17 MED ORDER — CLOTRIMAZOLE 1 % EX CREA: TOPICAL_CREAM | CUTANEOUS | 0 refills | Status: AC

## 2021-12-17 NOTE — Discharge Instructions (Addendum)
Please follow-up with your primary care doctor.  Stop using your external catheter, as this may be causing your yeast infection.  Use the cream as directed twice a day around your penis/glans.  Make sure that you are keeping the area dry and clean.  Moisture will help with the yeast growth.

## 2021-12-17 NOTE — ED Triage Notes (Signed)
Pt has prostate cancer, yesterday developed a white area on his penis, No actual urinary symptoms. Last chemo 4 weeks ago

## 2021-12-17 NOTE — ED Notes (Signed)
Pt in bed, family at bedside, pt c/o some white discharge/ smegma like substance on the head of his penis. Pt reports some discomfort at the end of his urination stream.  Resps even and unlabored, pt spanish speaking.  Family at bedside helping to interpret.

## 2021-12-17 NOTE — ED Provider Notes (Signed)
[facility] DEPT Provider Note   CSN: [id number] Arrival date & time: 12/17/21  1444     History  Chief Complaint  Patient presents with   Urinary symptoms    Terrence Rivera is a 81 y.o. male, c/o white area on penisx1 day. Currently on antibiotics, for an infection of the hand. Last chemo 1 month ago. Oncologist: Dr. Almon Hercules.  Has been using an external catheter to reduce the frequency of having to run to the bathroom at night.  States it is slightly painful at the end of his urinary stream.  Denies any increased frequency or urgency of urine. Denies any STD concerns.No penile discharge per patient.     Home Medications Prior to Admission medications   Medication Sig Start Date End Date Taking? Authorizing Provider  clotrimazole (LOTRIMIN) 1 % cream Apply to affected area 2 times daily 12/17/21 12/31/21 Yes Duaa Stelzner L, PA  lidocaine-prilocaine (EMLA) cream Apply 1 Application topically as needed. 10/30/21   Wyatt Portela, MD  lisinopril (ZESTRIL) 20 MG tablet Take 1 tablet (20 mg total) by mouth daily. 06/14/19   Cardama, Grayce Sessions, MD  oxyCODONE (OXY IR/ROXICODONE) 5 MG immediate release tablet Take 1 tablet (5 mg total) by mouth every 4 (four) hours as needed for moderate pain. Patient not taking: Reported on 12/01/2021 09/24/21   Lucas Mallow, MD  predniSONE (DELTASONE) 10 MG tablet Take 4 tablets (40 mg total) by mouth daily for 2 days, THEN 3 tablets (30 mg total) daily for 3 days, THEN 2 tablets (20 mg total) daily for 3 days, THEN 1 tablet (10 mg total) daily for 3 days, THEN 0.5 tablets (5 mg total) daily for 4 days. 12/04/21 12/19/21  [place] Donnamarie Poag, DO  prochlorperazine (COMPAZINE) 10 MG tablet Take 1 tablet (10 mg total) by mouth every 6 (six) hours as needed for nausea or vomiting. 10/30/21   Wyatt Portela, MD      Allergies    Patient has no known allergies.    Review of Systems   Review of Systems  Genitourinary:   Positive for difficulty urinating. Negative for flank pain, frequency, penile discharge, penile pain, penile swelling, testicular pain and urgency.    Physical Exam Updated Vital Signs BP (!) 145/67   Pulse 76   Temp 97.9 F (36.6 C) (Oral)   Resp 18   Ht '5\' 8"'$  (1.727 m)   Wt 61.2 kg   SpO2 94%   BMI 20.53 kg/m  Physical Exam Vitals and nursing note reviewed. Exam conducted with a chaperone present.  Constitutional:      General: He is not in acute distress.    Appearance: He is well-developed.  HENT:     Head: Normocephalic and atraumatic.  Eyes:     Conjunctiva/sclera: Conjunctivae normal.  Cardiovascular:     Rate and Rhythm: Normal rate and regular rhythm.     Heart sounds: No murmur heard. Pulmonary:     Effort: Pulmonary effort is normal. No respiratory distress.     Breath sounds: Normal breath sounds.  Abdominal:     Palpations: Abdomen is soft.     Tenderness: There is no abdominal tenderness.  Genitourinary:    Penis: Circumcised.      Comments: +white discharge present with white thin layer over penile head and glans, minimal to no edema. No testicular pain.  Musculoskeletal:        General: No swelling.     Cervical back: Neck  supple.  Skin:    General: Skin is warm and dry.     Capillary Refill: Capillary refill takes less than 2 seconds.  Neurological:     Mental Status: He is alert.  Psychiatric:        Mood and Affect: Mood normal.     ED Results / Procedures / Treatments   Labs (all labs ordered are listed, but only abnormal results are displayed) Labs Reviewed  URINALYSIS, ROUTINE W REFLEX MICROSCOPIC - Abnormal; Notable for the following components:      Result Value   APPearance HAZY (*)    Glucose, UA 150 (*)    Hgb urine dipstick LARGE (*)    Protein, ur 100 (*)    Leukocytes,Ua MODERATE (*)    RBC / HPF >50 (*)    WBC, UA >50 (*)    All other components within normal limits    EKG None  Radiology No results  found.  Procedures Procedures    Medications Ordered in ED Medications  fluconazole (DIFLUCAN) tablet 150 mg (150 mg Oral Given 12/17/21 1551)    ED Course/ Medical Decision Making/ A&P                           Medical Decision Making Amount and/or Complexity of Data Reviewed Labs: ordered.  Risk Prescription drug management.   Patient is a 81 year old male, here for white discharge/white discoloration of his penis.  Is been going for about 1 day.  He is currently on antibiotics for a hand infection, and is a cancer patient.  He is going through radiation as well.  On exam appears to be like balanitis, given 1 dose of Diflucan, and clotrimazole cream prescription for discharge.  He was advised to stop using external catheter as this may be promoting yeast growth.  Discussed PeriCare, importance of keeping area dry and clean.  He voiced understanding, and will follow-up with his primary care doctor outpatient. Final Clinical Impression(s) / ED Diagnoses Final diagnoses:  Balanitis    Rx / DC Orders ED Discharge Orders          Ordered    clotrimazole (LOTRIMIN) 1 % cream        12/17/21 1535              Tylek Boney, [place], [place] 12/17/21 1712    Audley Hose, MD 12/17/21 2333

## 2021-12-19 ENCOUNTER — Other Ambulatory Visit: Payer: Self-pay

## 2021-12-23 MED FILL — Fosaprepitant Dimeglumine For IV Infusion 150 MG (Base Eq): INTRAVENOUS | Qty: 5 | Status: AC

## 2021-12-23 MED FILL — Dexamethasone Sodium Phosphate Inj 100 MG/10ML: INTRAMUSCULAR | Qty: 1 | Status: AC

## 2021-12-24 ENCOUNTER — Inpatient Hospital Stay: Payer: Medicare (Managed Care)

## 2021-12-24 ENCOUNTER — Inpatient Hospital Stay: Payer: Medicare (Managed Care) | Attending: Oncology

## 2021-12-24 ENCOUNTER — Telehealth: Payer: Self-pay | Admitting: *Deleted

## 2021-12-24 ENCOUNTER — Inpatient Hospital Stay (HOSPITAL_BASED_OUTPATIENT_CLINIC_OR_DEPARTMENT_OTHER): Payer: Medicare (Managed Care) | Admitting: Oncology

## 2021-12-24 ENCOUNTER — Other Ambulatory Visit: Payer: Medicare (Managed Care)

## 2021-12-24 VITALS — BP 152/81 | HR 82 | Temp 97.7°F | Resp 16 | Ht 68.0 in | Wt 142.3 lb

## 2021-12-24 DIAGNOSIS — C679 Malignant neoplasm of bladder, unspecified: Secondary | ICD-10-CM | POA: Diagnosis not present

## 2021-12-24 DIAGNOSIS — Z5111 Encounter for antineoplastic chemotherapy: Secondary | ICD-10-CM | POA: Insufficient documentation

## 2021-12-24 LAB — CMP (CANCER CENTER ONLY)
ALT: 18 U/L (ref 0–44)
AST: 15 U/L (ref 15–41)
Albumin: 3.2 g/dL — ABNORMAL LOW (ref 3.5–5.0)
Alkaline Phosphatase: 77 U/L (ref 38–126)
Anion gap: 6 (ref 5–15)
BUN: 26 mg/dL — ABNORMAL HIGH (ref 8–23)
CO2: 27 mmol/L (ref 22–32)
Calcium: 8.4 mg/dL — ABNORMAL LOW (ref 8.9–10.3)
Chloride: 102 mmol/L (ref 98–111)
Creatinine: 1.08 mg/dL (ref 0.61–1.24)
GFR, Estimated: >60 mL/min (ref >60)
Glucose, Bld: 106 mg/dL — ABNORMAL HIGH (ref 70–99)
Potassium: 3.5 mmol/L (ref 3.5–5.1)
Sodium: 135 mmol/L (ref 135–145)
Total Bilirubin: 0.5 mg/dL (ref 0.3–1.2)
Total Protein: 6.7 g/dL (ref 6.5–8.1)

## 2021-12-24 LAB — CBC WITH DIFFERENTIAL (CANCER CENTER ONLY)
Abs Immature Granulocytes: 0.06 10*3/uL (ref 0.00–0.07)
Basophils Absolute: 0 10*3/uL (ref 0.0–0.1)
Basophils Relative: 0 %
Eosinophils Absolute: 0.4 10*3/uL (ref 0.0–0.5)
Eosinophils Relative: 5 %
HCT: 26.4 % — ABNORMAL LOW (ref 39.0–52.0)
Hemoglobin: 8.4 g/dL — ABNORMAL LOW (ref 13.0–17.0)
Immature Granulocytes: 1 %
Lymphocytes Relative: 23 %
Lymphs Abs: 2.1 10*3/uL (ref 0.7–4.0)
MCH: 27.1 pg (ref 26.0–34.0)
MCHC: 31.8 g/dL (ref 30.0–36.0)
MCV: 85.2 fL (ref 80.0–100.0)
Monocytes Absolute: 0.6 10*3/uL (ref 0.1–1.0)
Monocytes Relative: 7 %
Neutro Abs: 5.8 10*3/uL (ref 1.7–7.7)
Neutrophils Relative %: 64 %
Platelet Count: 194 10*3/uL (ref 150–400)
RBC: 3.1 MIL/uL — ABNORMAL LOW (ref 4.22–5.81)
RDW: 22.9 % — ABNORMAL HIGH (ref 11.5–15.5)
WBC Count: 9 10*3/uL (ref 4.0–10.5)
nRBC: 0 % (ref 0.0–0.2)

## 2021-12-24 LAB — MAGNESIUM: Magnesium: 1.7 mg/dL (ref 1.7–2.4)

## 2021-12-24 MED ORDER — POTASSIUM CHLORIDE IN NACL 20-0.9 MEQ/L-% IV SOLN
Freq: Once | INTRAVENOUS | Status: AC
  Filled 2021-12-24: qty 1000

## 2021-12-24 MED ORDER — SODIUM CHLORIDE 0.9 % IV SOLN: Freq: Once | INTRAVENOUS | Status: AC

## 2021-12-24 MED ORDER — SODIUM CHLORIDE 0.9 % IV SOLN: Freq: Once | INTRAVENOUS | Status: DC

## 2021-12-24 MED ORDER — SODIUM CHLORIDE 0.9 % IV SOLN
800.0000 mg/m2 | Freq: Once | INTRAVENOUS | Status: AC
  Administered 2021-12-24: 1444 mg via INTRAVENOUS
  Filled 2021-12-24: qty 37.98

## 2021-12-24 MED ORDER — SODIUM CHLORIDE 0.9 % IV SOLN
50.0000 mg/m2 | Freq: Once | INTRAVENOUS | Status: AC
  Administered 2021-12-24: 90 mg via INTRAVENOUS
  Filled 2021-12-24: qty 90

## 2021-12-24 MED ORDER — SODIUM CHLORIDE 0.9% FLUSH
10.0000 mL | INTRAVENOUS | Status: DC | PRN
  Administered 2021-12-24: 10 mL

## 2021-12-24 MED ORDER — HEPARIN SOD (PORK) LOCK FLUSH 100 UNIT/ML IV SOLN
500.0000 [IU] | Freq: Once | INTRAVENOUS | Status: AC | PRN
  Administered 2021-12-24: 500 [IU]

## 2021-12-24 MED ORDER — MAGNESIUM SULFATE 2 GM/50ML IV SOLN
2.0000 g | Freq: Once | INTRAVENOUS | Status: AC
  Administered 2021-12-24: 2 g via INTRAVENOUS
  Filled 2021-12-24: qty 50

## 2021-12-24 MED ORDER — HEPARIN SOD (PORK) LOCK FLUSH 100 UNIT/ML IV SOLN: 500.0000 [IU] | Freq: Once | INTRAVENOUS | Status: DC | PRN

## 2021-12-24 MED ORDER — PALONOSETRON HCL INJECTION 0.25 MG/5ML
0.2500 mg | Freq: Once | INTRAVENOUS | Status: AC
  Administered 2021-12-24: 0.25 mg via INTRAVENOUS
  Filled 2021-12-24: qty 5

## 2021-12-24 MED ORDER — SODIUM CHLORIDE 0.9 % IV SOLN
150.0000 mg | Freq: Once | INTRAVENOUS | Status: AC
  Administered 2021-12-24: 150 mg via INTRAVENOUS
  Filled 2021-12-24: qty 150

## 2021-12-24 MED ORDER — SODIUM CHLORIDE 0.9 % IV SOLN
10.0000 mg | Freq: Once | INTRAVENOUS | Status: AC
  Administered 2021-12-24: 10 mg via INTRAVENOUS
  Filled 2021-12-24: qty 10

## 2021-12-24 NOTE — Patient Instructions (Addendum)
Instrucciones al darle de alta: Discharge Instructions Gracias por elegir al [facility] de Cncer de [place] para brindarle atencin mdica de oncologa y Music therapist.   Si usted tiene una cita de laboratorio con [place], por favor vaya directamente [place] y regstrese en el rea de Control and instrumentation engineer.   Use ropa cmoda y [place] para tener fcil acceso a las vas del Portacath (acceso venoso de Engineer, site duracin) o la lnea PICC (catter central colocado por va perifrica).   Nos esforzamos por ofrecerle tiempo de calidad con su proveedor. Es posible que tenga que volver a programar su cita si llega tarde (15 minutos o ms).  El llegar tarde le afecta a usted y a otros pacientes cuyas citas son posteriores a Merchandiser, retail.  Adems, si usted falta a tres o ms citas sin avisar a la oficina, puede ser retirado(a) de la clnica a discrecin del proveedor.      Para las solicitudes de renovacin de recetas, pida a su farmacia que se ponga en contacto con nuestra oficina y deje que transcurran 68 horas para que se complete el proceso de las renovaciones.    Hoy usted recibi los siguientes agentes de quimioterapia e/o inmunoterapia: Gemcitabine (Gemzar) and Carboplatin.   Para ayudar a prevenir las nuseas y los vmitos despus de su tratamiento, le recomendamos que tome su medicamento para las nuseas segn las indicaciones.  LOS SNTOMAS QUE DEBEN COMUNICARSE INMEDIATAMENTE SE INDICAN A CONTINUACIN: *FIEBRE SUPERIOR A 100.4 F (38 C) O MS *ESCALOFROS O SUDORACIN *NUSEAS Y VMITOS QUE NO SE CONTROLAN CON EL MEDICAMENTO PARA LAS NUSEAS *DIFICULTAD INUSUAL PARA RESPIRAR  *MORETONES O HEMORRAGIAS NO HABITUALES *PROBLEMAS URINARIOS (dolor o ardor al Garment/textile technologist o frecuencia para Garment/textile technologist) *PROBLEMAS INTESTINALES (diarrea inusual, estreimiento, dolor cerca del ano) SENSIBILIDAD EN LA BOCA Y EN LA GARGANTA CON O SIN LA PRESENCIA DE LCERAS (dolor de garganta, llagas en la boca o dolor de  muelas/dientes) ERUPCIN, HINCHAZN O DOLORES INUSUALES FLUJO VAGINAL INUSUAL O PICAZN/RASQUIA    Los puntos marcados con un asterisco ( *) indican una posible emergencia y debe hacer un seguimiento tan pronto como le sea posible o vaya al Departamento de Emergencias si se le presenta algn problema.  Por favor, muestre la [place] DE ADVERTENCIA DE Windy Canny DE ADVERTENCIA DE Benay Spice al registrarse en [address] de Emergencias y a la enfermera de triaje.  Si tiene preguntas despus de su visita o necesita cancelar o volver a programar su cita, por favor pngase en contacto con [place]  Dept: [phone] y [place]. Las horas de oficina son de 8:00 a.m. a 4:30 p.m. de lunes a viernes. Por favor, tenga en cuenta que los mensajes de voz que se dejan despus de las 4:00 p.m. posiblemente no se devolvern hasta el siguiente da de [place].  Cerramos los fines de semana y [employer]. En todo momento tiene acceso a una enfermera para preguntas urgentes. Por favor, llame al nmero principal de la clnica Dept: [phone] y [place] instrucciones.   Para cualquier pregunta que no sea de carcter urgente, tambin puede ponerse en contacto con su proveedor [employer] Ahora ofrecemos visitas electrnicas para cualquier persona mayor de 18 aos que solicite atencin mdica en lnea para los sntomas que no sean urgentes. Para ms detalles vaya a [email or web address].   Tambin puede bajar la aplicacin de MyChart! Vaya a la tienda de aplicaciones, busque "MyChart", abra la aplicacin, seleccione  [place], e ingrese con su nombre de usuario y la contrasea de Pharmacist, community.  Las mscaras son opcionales en los centros de Hotel manager. Si desea que su equipo de cuidados mdicos use una [employer] atienden, por favor hgaselo saber al personal. Bethann Berkshire una persona de apoyo que tenga por lo menos 16 aos  para que le acompae a sus citas. Gemcitabine Injection Qu es este medicamento? La GEMCITABINA es un agente quimioteraputico. Este medicamento se [place] para tratar muchos tipos de cncer, tales como cncer de mama, cncer de pulmn, cncer de pncreas y cncer de ovario. Este medicamento puede ser utilizado para otros usos; si tiene alguna pregunta consulte con su proveedor de atencin mdica o con su farmacutico. MARCAS COMUNES: Gemzar, Infugem Qu le debo informar a mi profesional de la salud antes de tomar este medicamento? Necesitan saber si usted presenta alguno de los siguientes problemas o situaciones: trastornos sanguneos infeccin enfermedad renal enfermedad heptica enfermedad pulmonar o respiratoria, como asma terapia de radiacin en curso o reciente una reaccin alrgica o inusual a la gemcitabina, a otros medicamentos quimioteraputicos, a otros medicamentos, alimentos, colorantes o conservantes si est embarazada o buscando quedar embarazada si est amamantando a un beb Cmo debo utilizar este medicamento? Este medicamento se administra mediante infusin por va intravenosa. Lo administra un profesional de la salud calificado en un hospital o en un entorno clnico. Hable con su pediatra para informarse acerca del uso de este medicamento en nios. Puede requerir atencin especial. Sobredosis: Pngase en contacto inmediatamente con un centro toxicolgico o una sala de urgencia si usted cree que haya tomado demasiado medicamento. ATENCIN: [employer] es solo para usted. No comparta este medicamento con nadie. Qu sucede si me olvido de una dosis? Es importante no olvidar ninguna dosis. Informe a su mdico o a su profesional de la salud si no puede asistir a Photographer. Qu puede interactuar con este medicamento? medicamentos para incrementar los conteos sanguneos, tales como filgrastim, pegfilgrastim, sargramostim algunos otros agentes quimioteraputicos como  cisplatino vacunas Consulte a su mdico o a su profesional de la salud antes de tomar cualquiera de los siguientes medicamentos: acetaminofeno aspirina ibuprofeno quetoprofeno naproxeno Puede ser que esta lista no menciona todas las posibles interacciones. Informe a su profesional de [employer]	[place] de [employer] productos a base de hierbas, medicamentos de [place] o suplementos nutritivos que est tomando. Si usted fuma, consume bebidas alcohlicas o si utiliza drogas ilegales, indqueselo tambin a su profesional de [employer]	[place]. Algunas sustancias pueden interactuar con su medicamento. A qu debo estar atento al usar Coca[employer]Cola? Visite a su mdico para que revise su evolucin. Este medicamento podra hacerle sentir un Nurse, mental health. Esto es normal, ya que la quimioterapia puede afectar tanto a las clulas sanas como a las clulas cancerosas. Si presenta algn efecto secundario, infrmelo. Contine con el tratamiento aun si se siente enfermo, a menos que su mdico le indique que lo suspenda. En algunos casos, podra recibir [employer] Brands para ayudarlo con los efectos secundarios. Siga todas las instrucciones para usarlos. Consulte a su mdico o a su profesional de la salud si tiene fiebre, escalofros o dolor de garganta, o cualquier otro sntoma de resfro o gripe. No se trate usted mismo. Este medicamento reduce la capacidad del cuerpo para combatir infecciones. Trate de no acercarse a personas que estn enfermas. Este medicamento podra aumentar el riesgo de moretones o sangrado. Consulte a su mdico o a su profesional de la salud si observa sangrados inusuales. Proceda  con cuidado al cepillar sus dientes, usar hilo dental o Risk manager palillos para los dientes, ya que podra contraer una infeccin o Therapist, art con mayor facilidad. Si se somete a algn tratamiento dental, informe a su dentista que est [employer]. Evite usar productos que contienen aspirina, acetaminofeno,  ibuprofeno, naproxeno o ketoprofeno, a menos que as lo indique su mdico. Estos productos pueden ocultar la fiebre. No debe quedar embarazada mientras est usando este medicamento o por 6 meses despus de dejar de usarlo. Las mujeres deben informar a su mdico si estn buscando quedar embarazadas o si creen que podran estar embarazadas. Los hombres no deben tener hijos mientras estn recibiendo Coca[employer]Cola y [place] 3 meses despus de dejar de usarlo. Existe la posibilidad de efectos secundarios graves en un beb sin nacer. Para obtener ms informacin, hable con su profesional de la salud o su farmacutico. No debe Economist a un beb mientras est tomando este medicamento o durante al menos 1 semana despus de dejar de usarlo. Los hombres deben informar a su mdico si quieren tener hijos. Este medicamento puede reducir el recuento de esperma. Hable con su mdico o su profesional de la salud si est preocupado por su fertilidad. Qu efectos secundarios puedo tener al [employer] este medicamento? Efectos secundarios que debe informar a su mdico o a Barrister's clerk de la salud tan pronto como sea posible: Chief of Staff, como erupcin cutnea, comezn/picazn o urticaria, e hinchazn de la cara, los labios o la lengua problemas Theatre stage manager, enrojecimiento o Actor de la inyeccin signos y sntomas de un cambio peligroso en el pulso o ritmo cardiaco, tales como dolor en el pecho; mareos; ritmo cardiaco rpido o irregular; palpitaciones; sensacin de desmayo o aturdimiento, cadas; problemas respiratorios signos de disminucin en la cantidad de plaquetas o sangrado: moretones, puntos rojos en la piel, heces de color negro y aspecto alquitranado, sangre en la orina signos de disminucin en la cantidad de glbulos rojos: cansancio o debilidad inusual, sensacin de Secondary school teacher o aturdimiento, cadas signos de infeccin: fiebre o escalofros, tos, dolor de garganta, dolor o  dificultad para orinar signos y sntomas de lesin al rin, tales como dificultad para orinar o cambios en la cantidad de orina signos y sntomas de lesin al hgado, como orina amarilla oscura o [place]; sensacin general de estar enfermo o sntomas gripales; heces claras; prdida del apetito; nuseas; dolor en la regin abdominal superior derecha; cansancio o debilidad inusual; color amarillento de los ojos o la piel hinchazn de tobillos, pies, manos Efectos secundarios que generalmente no requieren atencin mdica (infrmelos a su mdico o a su profesional de la salud si persisten o si son molestos): estreimiento diarrea cada del cabello prdida del apetito nuseas erupcin cutnea vmito Puede ser que esta lista no menciona todos los posibles efectos secundarios. Comunquese a su mdico por asesoramiento mdico [employer]. Usted puede informar los efectos secundarios a la FDA por telfono al 1-800-FDA-1088. Dnde debo guardar mi medicina? Este medicamento se administra en hospitales o clnicas y no necesitar guardarlo en su domicilio. ATENCIN: Este folleto es un resumen. Puede ser que no cubra toda la posible informacin. Si usted tiene preguntas acerca de esta medicina, consulte con su mdico, su farmacutico o su profesional de Technical sales engineer.  2023 Elsevier/Gold Standard (2017-07-05 00:00:00) Carboplatin Injection What is this medication? CARBOPLATIN (KAR boe pla tin) treats some types of cancer. It works by slowing down the growth of cancer cells. This medicine may be used for other  purposes; ask your health care provider or pharmacist if you have questions. COMMON BRAND NAME(S): Paraplatin What should I tell my care team before I take this medication? They need to know if you have any of these conditions: Blood disorders Hearing problems Kidney disease Recent or ongoing radiation therapy An unusual or allergic reaction to carboplatin, cisplatin, other  medications, foods, dyes, or preservatives Pregnant or trying to get pregnant Breast-feeding How should I use this medication? This medication is injected into a vein. It is given by your care team in a hospital or clinic setting. Talk to your care team about the use of this medication in children. Special care may be needed. Overdosage: If you think you have taken too much of this medicine contact a poison control center or emergency room at once. NOTE: This medicine is only for you. Do not share this medicine with others. What if I miss a dose? Keep appointments for follow-up doses. It is important not to miss your dose. Call your care team if you are unable to keep an appointment. What may interact with this medication? Medications for seizures Some antibiotics, such as amikacin, gentamicin, neomycin, streptomycin, tobramycin Vaccines This list may not describe all possible interactions. Give your health care provider a list of all the medicines, herbs, non-prescription drugs, or dietary supplements you use. Also tell them if you smoke, drink alcohol, or use illegal drugs. Some items may interact with your medicine. What should I watch for while using this medication? Your condition will be monitored carefully while you are receiving this medication. You may need blood work while taking this medication. This medication may make you feel generally unwell. This is not uncommon, as chemotherapy can affect healthy cells as well as cancer cells. Report any side effects. Continue your course of treatment even though you feel ill unless your care team tells you to stop. In some cases, you may be given additional medications to help with side effects. Follow all directions for their use. This medication may increase your risk of getting an infection. Call your care team for advice if you get a fever, chills, sore throat, or other symptoms of a cold or flu. Do not treat yourself. Try to avoid being  around people who are sick. Avoid taking medications that contain aspirin, acetaminophen, ibuprofen, naproxen, or ketoprofen unless instructed by your care team. These medications may hide a fever. Be careful brushing or flossing your teeth or using a toothpick because you may get an infection or bleed more easily. If you have any dental work done, tell your dentist you are receiving this medication. Talk to your care team if you wish to become pregnant or think you might be pregnant. This medication can cause serious birth defects. Talk to your care team about effective forms of contraception. Do not breast-feed while taking this medication. What side effects may I notice from receiving this medication? Side effects that you should report to your care team as soon as possible: Allergic reactions--skin rash, itching, hives, swelling of the face, lips, tongue, or throat Infection--fever, chills, cough, sore throat, wounds that don't heal, pain or trouble when passing urine, general feeling of discomfort or being unwell Low red blood cell level--unusual weakness or fatigue, dizziness, headache, trouble breathing Pain, tingling, or numbness in the hands or feet, muscle weakness, change in vision, confusion or trouble speaking, loss of balance or coordination, trouble walking, seizures Unusual bruising or bleeding Side effects that usually do not require medical attention (report  to your care team if they continue or are bothersome): Hair loss Nausea Unusual weakness or fatigue Vomiting This list may not describe all possible side effects. Call your doctor for medical advice about side effects. You may report side effects to FDA at 1-800-FDA-1088. Where should I keep my medication? This medication is given in a hospital or clinic. It will not be stored at home. NOTE: This sheet is a summary. It may not cover all possible information. If you have questions about this medicine, talk to your doctor,  pharmacist, or health care provider.  2023 Elsevier/Gold Standard (2021-06-16 00:00:00)

## 2021-12-24 NOTE — Telephone Encounter (Signed)
PC to Dr Bethena Roys office, [facility] - referral information for this patient given, patient has appointment on 02/09/22 at 10:30.  Copy of surgical pathology & today's office note faxed, fax confirmation received.

## 2021-12-24 NOTE — Progress Notes (Signed)
Today is Cycle 2, Day 1 per MD.  Acquanetta Belling, RPH, BCPS, BCOP 12/24/2021 10:20 AM

## 2021-12-24 NOTE — Progress Notes (Signed)
Informed patient of his upcoming appointment with Dr Blair Promise at [facility] on 02/09/22 @ 10:30 a.m. with Almyra Free, [place] interpreter.  Patient & his wife verbalize understanding.

## 2021-12-24 NOTE — Progress Notes (Signed)
Hematology and Oncology Follow Up Visit  Terrence Rivera [id number] Aug 27, 1940 81 y.o. 12/24/2021 9:06 AM [name], MDShadad, Mathis Dad, MD   Principle Diagnosis: 77 year old man with bladder cancer diagnosed in May 2023.  He was found to have multifocal disease involving the prostatic urethra with high-grade tumor into the muscularis propria.   Prior Therapy:  He underwent cystoscopy and TURBT on September 23, 2021 which showed a large tumor involving the prostatic urethra with large volume disease around the ureteral orifices.   Current therapy: Neoadjuvant chemotherapy utilizing gemcitabine and cisplatin started on November 13, 2021.  Therapy has been interrupted due to hospitalization.  He is here for day 1 cycle 2.  Interim History: Terrence Rivera returns today for a follow-up visit.  Since last visit, he was hospitalized in September for synovitis that required intravenous antibiotics and arthrocentesis.  He was then noted to have increase in his creatinine up to 1.8 and improved with hydration.  Clinically, he reports feeling very well at this time without any major complaints.  He denies any nausea vomiting or abdominal pain.  He denies any bone pain or pathological fractures.  He denies any worsening neuropathy.     Medications: I have reviewed the patient's current medications.  Current Outpatient Medications  Medication Sig Dispense Refill   clotrimazole (LOTRIMIN) 1 % cream Apply to affected area 2 times daily 28 g 0   lidocaine-prilocaine (EMLA) cream Apply 1 Application topically as needed. 30 g 0   lisinopril (ZESTRIL) 20 MG tablet Take 1 tablet (20 mg total) by mouth daily. 60 tablet 0   oxyCODONE (OXY IR/ROXICODONE) 5 MG immediate release tablet Take 1 tablet (5 mg total) by mouth every 4 (four) hours as needed for moderate pain. (Patient not taking: Reported on 12/01/2021) 30 tablet 0   prochlorperazine (COMPAZINE) 10 MG tablet Take 1 tablet (10 mg total) by  mouth every 6 (six) hours as needed for nausea or vomiting. 30 tablet 0   No current facility-administered medications for this visit.   Facility-Administered Medications Ordered in Other Visits  Medication Dose Route Frequency Provider Last Rate Last Admin   0.9 %  sodium chloride infusion   Intravenous Once Wyatt Portela, MD         Allergies: No Known Allergies    Physical Exam: Blood pressure (!) 152/81, pulse 82, temperature 97.7 F (36.5 C), temperature source Temporal, resp. rate 16, height '5\' 8"'$  (1.727 m), weight 142 lb 4.8 oz (64.5 kg), SpO2 100 %.  ECOG:    General appearance: Comfortable appearing without any discomfort Head: Normocephalic without any trauma Oropharynx: Mucous membranes are moist and pink without any thrush or ulcers. Eyes: Pupils are equal and round reactive to light. Lymph nodes: No cervical, supraclavicular, inguinal or axillary lymphadenopathy.   Heart:regular rate and rhythm.  S1 and S2 without leg edema. Lung: Clear without any rhonchi or wheezes.  No dullness to percussion. Abdomin: Soft, nontender, nondistended with good bowel sounds.  No hepatosplenomegaly. Musculoskeletal: No joint deformity or effusion.  Full range of motion noted. Neurological: No deficits noted on motor, sensory and deep tendon reflex exam. Skin: No petechial rash or dryness.  Appeared moist.      Lab Results: Lab Results  Component Value Date   WBC 15.7 (H) 12/04/2021   HGB 7.8 (L) 12/04/2021   HCT 26.2 (L) 12/04/2021   MCV 87.6 12/04/2021   PLT 585 (H) 12/04/2021     Chemistry  Component Value Date/Time   NA 135 12/04/2021 0549   K 4.3 12/04/2021 0549   CL 104 12/04/2021 0549   CO2 21 (L) 12/04/2021 0549   BUN 25 (H) 12/04/2021 0549   CREATININE 1.28 (H) 12/04/2021 0549   CREATININE 1.81 (H) 11/20/2021 1350      Component Value Date/Time   CALCIUM 8.3 (L) 12/04/2021 0549   ALKPHOS 90 12/01/2021 0545   AST 25 12/01/2021 0545   AST 75 (H)  11/20/2021 1350   ALT 49 (H) 12/01/2021 0545   ALT 109 (H) 11/20/2021 1350   BILITOT 0.6 12/01/2021 0545   BILITOT 0.7 11/20/2021 1350       Radiological Studies:   Impression and Plan:  81 year old with:     1.  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   He is currently on neoadjuvant chemotherapy in preparation for possible cystectomy.  Risks and benefits of continuing chemotherapy were discussed today.  He did include nausea, vomiting, myelosuppression, neutropenia and possible sepsis were reiterated.  Worsening renal function and electrolyte imbalance were also reviewed.  Alternative treatment option at this time will be proceeding with a definitive therapy with radiation concomitantly with chemotherapy as an alternative.  He does not have any urology follow-up to discuss surgery due to insurance purposes.  I will make a referral to [facility] urology group for evaluation as he is still interested in primary surgical therapy.   2.  IV access: Port-A-Cath currently in place without any issues.   3.  Antiemetics: No nausea or vomiting reported at this time.  Compazine is available to him.   4.  Renal function surveillance: His kidney function will be monitored on platinum therapy.  His creatinine did improve after his recent hospitalization.   5.  Goals of care: Aggressive measures are warranted given his curative disease.   6.  Follow-up: In 1 week to complete the current cycle of chemotherapy and in 3 weeks for cycle 3.   30  minutes were spent on this encounter.  The time was dedicated to reviewing laboratory data, disease status update, treatment choices and outlining future plan of care discussion.       Zola Button, MD 10/19/20239:06 AM

## 2021-12-24 NOTE — Progress Notes (Signed)
Per Dr. Alen Blew, ok to start treatment hydration without lab results and ok to run post hydration fluids concurrently with [place].

## 2021-12-31 ENCOUNTER — Inpatient Hospital Stay: Payer: Medicare (Managed Care)

## 2021-12-31 ENCOUNTER — Other Ambulatory Visit: Payer: Self-pay

## 2021-12-31 VITALS — BP 129/90 | HR 83 | Temp 97.6°F | Resp 18 | Wt 142.0 lb

## 2021-12-31 DIAGNOSIS — C679 Malignant neoplasm of bladder, unspecified: Secondary | ICD-10-CM

## 2021-12-31 DIAGNOSIS — Z5111 Encounter for antineoplastic chemotherapy: Secondary | ICD-10-CM | POA: Diagnosis not present

## 2021-12-31 LAB — CMP (CANCER CENTER ONLY)
ALT: 46 U/L — ABNORMAL HIGH (ref 0–44)
AST: 17 U/L (ref 15–41)
Albumin: 3.3 g/dL — ABNORMAL LOW (ref 3.5–5.0)
Alkaline Phosphatase: 82 U/L (ref 38–126)
Anion gap: 5 (ref 5–15)
BUN: 31 mg/dL — ABNORMAL HIGH (ref 8–23)
CO2: 28 mmol/L (ref 22–32)
Calcium: 8.4 mg/dL — ABNORMAL LOW (ref 8.9–10.3)
Chloride: 104 mmol/L (ref 98–111)
Creatinine: 1.07 mg/dL (ref 0.61–1.24)
GFR, Estimated: >60 mL/min (ref >60)
Glucose, Bld: 170 mg/dL — ABNORMAL HIGH (ref 70–99)
Potassium: 3.8 mmol/L (ref 3.5–5.1)
Sodium: 137 mmol/L (ref 135–145)
Total Bilirubin: 0.3 mg/dL (ref 0.3–1.2)
Total Protein: 6.5 g/dL (ref 6.5–8.1)

## 2021-12-31 LAB — CBC WITH DIFFERENTIAL (CANCER CENTER ONLY)
Abs Immature Granulocytes: 0.03 10*3/uL (ref 0.00–0.07)
Basophils Absolute: 0 10*3/uL (ref 0.0–0.1)
Basophils Relative: 0 %
Eosinophils Absolute: 0.3 10*3/uL (ref 0.0–0.5)
Eosinophils Relative: 8 %
HCT: 24.3 % — ABNORMAL LOW (ref 39.0–52.0)
Hemoglobin: 7.9 g/dL — ABNORMAL LOW (ref 13.0–17.0)
Immature Granulocytes: 1 %
Lymphocytes Relative: 35 %
Lymphs Abs: 1.5 10*3/uL (ref 0.7–4.0)
MCH: 27.9 pg (ref 26.0–34.0)
MCHC: 32.5 g/dL (ref 30.0–36.0)
MCV: 85.9 fL (ref 80.0–100.0)
Monocytes Absolute: 0.1 10*3/uL (ref 0.1–1.0)
Monocytes Relative: 2 %
Neutro Abs: 2.3 10*3/uL (ref 1.7–7.7)
Neutrophils Relative %: 54 %
Platelet Count: 159 10*3/uL (ref 150–400)
RBC: 2.83 MIL/uL — ABNORMAL LOW (ref 4.22–5.81)
RDW: 23 % — ABNORMAL HIGH (ref 11.5–15.5)
WBC Count: 4.3 10*3/uL (ref 4.0–10.5)
nRBC: 0 % (ref 0.0–0.2)

## 2021-12-31 MED ORDER — SODIUM CHLORIDE 0.9 % IV SOLN: Freq: Once | INTRAVENOUS | Status: AC

## 2021-12-31 MED ORDER — SODIUM CHLORIDE 0.9% FLUSH
10.0000 mL | INTRAVENOUS | Status: DC | PRN
  Administered 2021-12-31: 10 mL via INTRAVENOUS

## 2021-12-31 MED ORDER — HEPARIN SOD (PORK) LOCK FLUSH 100 UNIT/ML IV SOLN
500.0000 [IU] | Freq: Once | INTRAVENOUS | Status: AC | PRN
  Administered 2021-12-31: 500 [IU]

## 2021-12-31 MED ORDER — SODIUM CHLORIDE 0.9 % IV SOLN
800.0000 mg/m2 | Freq: Once | INTRAVENOUS | Status: AC
  Administered 2021-12-31: 1444 mg via INTRAVENOUS
  Filled 2021-12-31: qty 37.98

## 2021-12-31 MED ORDER — PROCHLORPERAZINE MALEATE 10 MG PO TABS
10.0000 mg | ORAL_TABLET | Freq: Once | ORAL | Status: AC
  Administered 2021-12-31: 10 mg via ORAL
  Filled 2021-12-31: qty 1

## 2021-12-31 MED ORDER — SODIUM CHLORIDE 0.9 % IV SOLN: Freq: Once | INTRAVENOUS | Status: DC

## 2021-12-31 MED ORDER — SODIUM CHLORIDE 0.9% FLUSH
10.0000 mL | INTRAVENOUS | Status: DC | PRN
  Administered 2021-12-31: 10 mL

## 2021-12-31 NOTE — Patient Instructions (Signed)
Instrucciones al darle de alta: Discharge Instructions Gracias por elegir al [facility] de Cncer de [place] para brindarle atencin mdica de oncologa y Music therapist.   Si usted tiene una cita de laboratorio con [place], por favor vaya directamente [place] y regstrese en el rea de Control and instrumentation engineer.   Use ropa cmoda y [place] para tener fcil acceso a las vas del Portacath (acceso venoso de Engineer, site duracin) o la lnea PICC (catter central colocado por va perifrica).   Nos esforzamos por ofrecerle tiempo de calidad con su proveedor. Es posible que tenga que volver a programar su cita si llega tarde (15 minutos o ms).  El llegar tarde le afecta a usted y a otros pacientes cuyas citas son posteriores a Merchandiser, retail.  Adems, si usted falta a tres o ms citas sin avisar a la oficina, puede ser retirado(a) de la clnica a discrecin del proveedor.      Para las solicitudes de renovacin de recetas, pida a su farmacia que se ponga en contacto con nuestra oficina y deje que transcurran 20 horas para que se complete el proceso de las renovaciones.    Hoy usted recibi los siguientes agentes de quimioterapia e/o inmunoterapia: Gemcitabine.       Para ayudar a prevenir las nuseas y los vmitos despus de su tratamiento, le recomendamos que tome su medicamento para las nuseas segn las indicaciones.  LOS SNTOMAS QUE DEBEN COMUNICARSE INMEDIATAMENTE SE INDICAN A CONTINUACIN: *FIEBRE SUPERIOR A 100.4 F (38 C) O MS *ESCALOFROS O SUDORACIN *NUSEAS Y VMITOS QUE NO SE CONTROLAN CON EL MEDICAMENTO PARA LAS NUSEAS *DIFICULTAD INUSUAL PARA RESPIRAR  *MORETONES O HEMORRAGIAS NO HABITUALES *PROBLEMAS URINARIOS (dolor o ardor al Garment/textile technologist o frecuencia para Garment/textile technologist) *PROBLEMAS INTESTINALES (diarrea inusual, estreimiento, dolor cerca del ano) SENSIBILIDAD EN LA BOCA Y EN LA GARGANTA CON O SIN LA PRESENCIA DE LCERAS (dolor de garganta, llagas en la boca o dolor de muelas/dientes) ERUPCIN,  HINCHAZN O DOLORES INUSUALES FLUJO VAGINAL INUSUAL O PICAZN/RASQUIA    Los puntos marcados con un asterisco ( *) indican una posible emergencia y debe hacer un seguimiento tan pronto como le sea posible o vaya al Departamento de Emergencias si se le presenta algn problema.  Por favor, muestre la [place] DE ADVERTENCIA DE Windy Canny DE ADVERTENCIA DE Benay Spice al registrarse en [address] de Emergencias y a la enfermera de triaje.  Si tiene preguntas despus de su visita o necesita cancelar o volver a programar su cita, por favor pngase en contacto con [place]  Dept: [phone] y [place]. Las horas de oficina son de 8:00 a.m. a 4:30 p.m. de lunes a viernes. Por favor, tenga en cuenta que los mensajes de voz que se dejan despus de las 4:00 p.m. posiblemente no se devolvern hasta el siguiente da de [place].  Cerramos los fines de semana y [employer]. En todo momento tiene acceso a una enfermera para preguntas urgentes. Por favor, llame al nmero principal de la clnica Dept: [phone] y [place] instrucciones.   Para cualquier pregunta que no sea de carcter urgente, tambin puede ponerse en contacto con su proveedor [employer]. Ahora ofrecemos visitas electrnicas para cualquier persona mayor de 18 aos que solicite atencin mdica en lnea para los sntomas que no sean urgentes. Para ms detalles vaya a mychart.[email or web address].   Tambin puede bajar la aplicacin de MyChart! Vaya a la tienda de aplicaciones, busque "MyChart", abra la aplicacin,  seleccione [place], e ingrese con su nombre de usuario y la contrasea de Pharmacist, community.  Las mscaras son opcionales en los centros de Hotel manager. Si desea que su equipo de cuidados mdicos use una [employer] atienden, por favor hgaselo saber al personal. Bethann Berkshire una persona de apoyo que tenga por lo menos 16 aos para que le acompae a sus  citas.

## 2021-12-31 NOTE — Progress Notes (Signed)
Per Julien Nordmann MD, ok to treat today with HGB 7.9

## 2021-12-31 NOTE — Progress Notes (Signed)
Per Dr. Julien Nordmann it is ok to treat pt today with Gemzar c2 d8 and hgb of 7.9.

## 2022-01-04 ENCOUNTER — Other Ambulatory Visit: Payer: Self-pay

## 2022-01-12 ENCOUNTER — Other Ambulatory Visit: Payer: Self-pay

## 2022-01-13 MED FILL — Dexamethasone Sodium Phosphate Inj 100 MG/10ML: INTRAMUSCULAR | Qty: 1 | Status: AC

## 2022-01-13 MED FILL — Fosaprepitant Dimeglumine For IV Infusion 150 MG (Base Eq): INTRAVENOUS | Qty: 5 | Status: AC

## 2022-01-14 ENCOUNTER — Inpatient Hospital Stay: Payer: Medicare (Managed Care)

## 2022-01-14 ENCOUNTER — Inpatient Hospital Stay: Payer: Medicare (Managed Care) | Attending: Oncology | Admitting: Oncology

## 2022-01-14 ENCOUNTER — Other Ambulatory Visit: Payer: Self-pay

## 2022-01-14 VITALS — BP 166/97 | HR 99 | Temp 98.5°F | Resp 20 | Ht 68.0 in | Wt 147.0 lb

## 2022-01-14 DIAGNOSIS — C679 Malignant neoplasm of bladder, unspecified: Secondary | ICD-10-CM | POA: Insufficient documentation

## 2022-01-14 DIAGNOSIS — Z79899 Other long term (current) drug therapy: Secondary | ICD-10-CM | POA: Diagnosis not present

## 2022-01-14 DIAGNOSIS — Z7901 Long term (current) use of anticoagulants: Secondary | ICD-10-CM | POA: Insufficient documentation

## 2022-01-14 DIAGNOSIS — Z5111 Encounter for antineoplastic chemotherapy: Secondary | ICD-10-CM | POA: Diagnosis not present

## 2022-01-14 LAB — CBC WITH DIFFERENTIAL (CANCER CENTER ONLY)
Abs Immature Granulocytes: 0.02 10*3/uL (ref 0.00–0.07)
Basophils Absolute: 0 10*3/uL (ref 0.0–0.1)
Basophils Relative: 1 %
Eosinophils Absolute: 0.1 10*3/uL (ref 0.0–0.5)
Eosinophils Relative: 2 %
HCT: 25.7 % — ABNORMAL LOW (ref 39.0–52.0)
Hemoglobin: 8 g/dL — ABNORMAL LOW (ref 13.0–17.0)
Immature Granulocytes: 0 %
Lymphocytes Relative: 38 %
Lymphs Abs: 2.7 10*3/uL (ref 0.7–4.0)
MCH: 28.1 pg (ref 26.0–34.0)
MCHC: 31.1 g/dL (ref 30.0–36.0)
MCV: 90.2 fL (ref 80.0–100.0)
Monocytes Absolute: 1.2 10*3/uL — ABNORMAL HIGH (ref 0.1–1.0)
Monocytes Relative: 16 %
Neutro Abs: 3.1 10*3/uL (ref 1.7–7.7)
Neutrophils Relative %: 43 %
Platelet Count: 500 10*3/uL — ABNORMAL HIGH (ref 150–400)
RBC: 2.85 MIL/uL — ABNORMAL LOW (ref 4.22–5.81)
RDW: 23.5 % — ABNORMAL HIGH (ref 11.5–15.5)
WBC Count: 7.2 10*3/uL (ref 4.0–10.5)
nRBC: 0 % (ref 0.0–0.2)

## 2022-01-14 LAB — CMP (CANCER CENTER ONLY)
ALT: 14 U/L (ref 0–44)
AST: 18 U/L (ref 15–41)
Albumin: 3.5 g/dL (ref 3.5–5.0)
Alkaline Phosphatase: 76 U/L (ref 38–126)
Anion gap: 10 (ref 5–15)
BUN: 21 mg/dL (ref 8–23)
CO2: 25 mmol/L (ref 22–32)
Calcium: 8.7 mg/dL — ABNORMAL LOW (ref 8.9–10.3)
Chloride: 106 mmol/L (ref 98–111)
Creatinine: 1.44 mg/dL — ABNORMAL HIGH (ref 0.61–1.24)
GFR, Estimated: 49 mL/min — ABNORMAL LOW (ref >60)
Glucose, Bld: 150 mg/dL — ABNORMAL HIGH (ref 70–99)
Potassium: 3.6 mmol/L (ref 3.5–5.1)
Sodium: 141 mmol/L (ref 135–145)
Total Bilirubin: 0.4 mg/dL (ref 0.3–1.2)
Total Protein: 7.3 g/dL (ref 6.5–8.1)

## 2022-01-14 LAB — MAGNESIUM: Magnesium: 1.7 mg/dL (ref 1.7–2.4)

## 2022-01-14 MED ORDER — SODIUM CHLORIDE 0.9 % IV SOLN
50.0000 mg/m2 | Freq: Once | INTRAVENOUS | Status: DC
  Filled 2022-01-14: qty 90

## 2022-01-14 MED ORDER — SODIUM CHLORIDE 0.9 % IV SOLN
800.0000 mg/m2 | Freq: Once | INTRAVENOUS | Status: AC
  Administered 2022-01-14: 1444 mg via INTRAVENOUS
  Filled 2022-01-14: qty 37.98

## 2022-01-14 MED ORDER — MAGNESIUM SULFATE 2 GM/50ML IV SOLN
2.0000 g | Freq: Once | INTRAVENOUS | Status: AC
  Administered 2022-01-14: 2 g via INTRAVENOUS
  Filled 2022-01-14: qty 50

## 2022-01-14 MED ORDER — SODIUM CHLORIDE 0.9 % IV SOLN
10.0000 mg | Freq: Once | INTRAVENOUS | Status: AC
  Administered 2022-01-14: 10 mg via INTRAVENOUS
  Filled 2022-01-14: qty 10

## 2022-01-14 MED ORDER — PALONOSETRON HCL INJECTION 0.25 MG/5ML
0.2500 mg | Freq: Once | INTRAVENOUS | Status: AC
  Administered 2022-01-14: 0.25 mg via INTRAVENOUS
  Filled 2022-01-14: qty 5

## 2022-01-14 MED ORDER — POTASSIUM CHLORIDE IN NACL 20-0.9 MEQ/L-% IV SOLN
Freq: Once | INTRAVENOUS | Status: AC
  Filled 2022-01-14: qty 1000

## 2022-01-14 MED ORDER — SODIUM CHLORIDE 0.9% FLUSH
10.0000 mL | Freq: Once | INTRAVENOUS | Status: AC
  Administered 2022-01-14: 10 mL via INTRAVENOUS

## 2022-01-14 MED ORDER — SODIUM CHLORIDE 0.9% FLUSH
10.0000 mL | INTRAVENOUS | Status: DC | PRN
  Administered 2022-01-14: 10 mL

## 2022-01-14 MED ORDER — SODIUM CHLORIDE 0.9 % IV SOLN
150.0000 mg | Freq: Once | INTRAVENOUS | Status: AC
  Administered 2022-01-14: 150 mg via INTRAVENOUS
  Filled 2022-01-14: qty 150

## 2022-01-14 MED ORDER — HEPARIN SOD (PORK) LOCK FLUSH 100 UNIT/ML IV SOLN
500.0000 [IU] | Freq: Once | INTRAVENOUS | Status: AC | PRN
  Administered 2022-01-14: 500 [IU]

## 2022-01-14 MED ORDER — SODIUM CHLORIDE 0.9 % IV SOLN: Freq: Once | INTRAVENOUS | Status: AC

## 2022-01-14 NOTE — Progress Notes (Signed)
Per Dr. Alen Blew, Cisplatin discontinued and stop post hydration.

## 2022-01-14 NOTE — Progress Notes (Signed)
Hematology and Oncology Follow Up Visit  SHARIF RENDELL [id number] 1940/07/09 81 y.o. 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   Prior Therapy:  He underwent cystoscopy and TURBT on September 23, 2021 which showed a large tumor involving the prostatic urethra with large volume disease around the ureteral orifices.   Current therapy: Neoadjuvant chemotherapy utilizing gemcitabine and cisplatin started on November 13, 2021.  Therapy has been interrupted due to hospitalization.  He is here for day 1 cycle 3.  Interim History: [name] is here for a follow-up visit.  Since last visit, he reports feeling well without any major complaints.  He denies any nausea, vomiting or abdominal pain.  He denies any worsening Fatigue or tiredness.  He denies any peripheral neuropathy.  His performance status remains marginal at this time.    Medications: Updated on review Current Outpatient Medications  Medication Sig Dispense Refill   lidocaine-prilocaine (EMLA) cream Apply 1 Application topically as needed. 30 g 0   lisinopril (ZESTRIL) 20 MG tablet Take 1 tablet (20 mg total) by mouth daily. 60 tablet 0   oxyCODONE (OXY IR/ROXICODONE) 5 MG immediate release tablet Take 1 tablet (5 mg total) by mouth every 4 (four) hours as needed for moderate pain. (Patient not taking: Reported on 12/01/2021) 30 tablet 0   prochlorperazine (COMPAZINE) 10 MG tablet Take 1 tablet (10 mg total) by mouth every 6 (six) hours as needed for nausea or vomiting. 30 tablet 0   No current facility-administered medications for this visit.   Facility-Administered Medications Ordered in Other Visits  Medication Dose Route Frequency Provider Last Rate Last Admin   0.9 %  sodium chloride infusion    Intravenous Once Tiawanna Luchsinger, Mathis Dad, MD         Allergies: No Known Allergies    Physical Exam:     ECOG:     General appearance: Alert, awake without any distress. Head: Atraumatic without abnormalities Oropharynx: Without any thrush or ulcers. Eyes: No scleral icterus. Lymph nodes: No lymphadenopathy noted in the cervical, supraclavicular, or axillary nodes Heart:regular rate and rhythm, without any murmurs or gallops.   Lung: Clear to auscultation without any rhonchi, wheezes or dullness to percussion. Abdomin: Soft, nontender without any shifting dullness or ascites. Musculoskeletal: No clubbing or cyanosis. Neurological: No motor or sensory deficits. Skin: No rashes or lesions.       Lab Results: Lab Results  Component Value Date   WBC 4.3 12/31/2021   HGB 7.9 (L) 12/31/2021   HCT 24.3 (L) 12/31/2021   MCV 85.9 12/31/2021   PLT 159 12/31/2021     Chemistry      Component Value Date/Time   NA 137 12/31/2021 1409   K 3.8 12/31/2021 1409   CL 104 12/31/2021 1409   CO2 28 12/31/2021 1409   BUN 31 (H) 12/31/2021 1409   CREATININE 1.07 12/31/2021 1409      Component Value Date/Time   CALCIUM 8.4 (L) 12/31/2021 1409   ALKPHOS 82 12/31/2021 1409   AST 17 12/31/2021 1409   ALT 46 (H) 12/31/2021 1409   BILITOT 0.3 12/31/2021 1409       Radiological Studies:   Impression and Plan:  81 year old with:     1.  Bladder cancer diagnosed May 2023.  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   Risks and benefits of continuing neoadjuvant chemotherapy were discussed at this time.  These complications including myelosuppression, nausea, vomiting or abdominal pain were reiterated.  After discussion today he is agreeable to proceed with chemotherapy but his creatinine has increased at this time despite the dose reduction and cisplatin.  I recommended  discontinuation of cisplatin therapy and continue with gemcitabine for the last cycle.  Upon completing this cycle, I will update his staging scan and will consider cystectomy if he does not have disease outside of the bladder.   2.  IV access: Port-A-Cath currently in place without any issues.   3.  Antiemetics: No nausea or vomiting reported at this time.  Compazine is available to him.   4.  Renal function surveillance: His creatinine clearance decreased below 50 cc/min and we will discontinue cisplatin.   5.  Goals of care: Therapy remains curative at this time.   6.  Follow-up: He will return in 1 week to complete the current cycle of therapy and in the next 4 to 6 weeks for repeat follow-up.   30  minutes were dedicated to this visit.  The time was spent on updating disease status, treatment choices and outlining future plan of care discussion.       Zola Button, MD 11/9/20238:26 AM

## 2022-01-14 NOTE — Patient Instructions (Signed)
Instrucciones al darle de alta: Discharge Instructions Gracias por elegir al [facility] de Cncer de [place] para brindarle atencin mdica de oncologa y Music therapist.   Si usted tiene una cita de laboratorio con [place], por favor vaya directamente [place] y regstrese en el rea de Control and instrumentation engineer.   Use ropa cmoda y [place] para tener fcil acceso a las vas del Portacath (acceso venoso de Engineer, site duracin) o la lnea PICC (catter central colocado por va perifrica).   Nos esforzamos por ofrecerle tiempo de calidad con su proveedor. Es posible que tenga que volver a programar su cita si llega tarde (15 minutos o ms).  El llegar tarde le afecta a usted y a otros pacientes cuyas citas son posteriores a Merchandiser, retail.  Adems, si usted falta a tres o ms citas sin avisar a la oficina, puede ser retirado(a) de la clnica a discrecin del proveedor.      Para las solicitudes de renovacin de recetas, pida a su farmacia que se ponga en contacto con nuestra oficina y deje que transcurran 65 horas para que se complete el proceso de las renovaciones.    Hoy usted recibi los siguientes agentes de quimioterapia e/o inmunoterapia: Gemcitabine (Gemzar)   Para ayudar a prevenir las nuseas y los vmitos despus de su tratamiento, le recomendamos que tome su medicamento para las nuseas segn las indicaciones.  LOS SNTOMAS QUE DEBEN COMUNICARSE INMEDIATAMENTE SE INDICAN A CONTINUACIN: *FIEBRE SUPERIOR A 100.4 F (38 C) O MS *ESCALOFROS O SUDORACIN *NUSEAS Y VMITOS QUE NO SE CONTROLAN CON EL MEDICAMENTO PARA LAS NUSEAS *DIFICULTAD INUSUAL PARA RESPIRAR  *MORETONES O HEMORRAGIAS NO HABITUALES *PROBLEMAS URINARIOS (dolor o ardor al Garment/textile technologist o frecuencia para Garment/textile technologist) *PROBLEMAS INTESTINALES (diarrea inusual, estreimiento, dolor cerca del ano) SENSIBILIDAD EN LA BOCA Y EN LA GARGANTA CON O SIN LA PRESENCIA DE LCERAS (dolor de garganta, llagas en la boca o dolor de  muelas/dientes) ERUPCIN, HINCHAZN O DOLORES INUSUALES FLUJO VAGINAL INUSUAL O PICAZN/RASQUIA    Los puntos marcados con un asterisco ( *) indican una posible emergencia y debe hacer un seguimiento tan pronto como le sea posible o vaya al Departamento de Emergencias si se le presenta algn problema.  Por favor, muestre la [place] DE ADVERTENCIA DE Windy Canny DE ADVERTENCIA DE Benay Spice al registrarse en [address] de Emergencias y a la enfermera de triaje.  Si tiene preguntas despus de su visita o necesita cancelar o volver a programar su cita, por favor pngase en contacto con [place]  Dept: [phone] y [place]. Las horas de oficina son de 8:00 a.m. a 4:30 p.m. de lunes a viernes. Por favor, tenga en cuenta que los mensajes de voz que se dejan despus de las 4:00 p.m. posiblemente no se devolvern hasta el siguiente da de [place].  Cerramos los fines de semana y [employer]. En todo momento tiene acceso a una enfermera para preguntas urgentes. Por favor, llame al nmero principal de la clnica Dept: [phone] y [place] instrucciones.   Para cualquier pregunta que no sea de carcter urgente, tambin puede ponerse en contacto con su proveedor [employer] Ahora ofrecemos visitas electrnicas para cualquier persona mayor de 18 aos que solicite atencin mdica en lnea para los sntomas que no sean urgentes. Para ms detalles vaya a mychart.[email or web address].   Tambin puede bajar la aplicacin de MyChart! Vaya a la tienda de aplicaciones, busque "MyChart", abra la aplicacin, seleccione [employer],  e ingrese con su nombre de usuario y la contrasea de Pharmacist, community.  Las mscaras son opcionales en los centros de Hotel manager. Si desea que su equipo de cuidados mdicos use una [employer] atienden, por favor hgaselo saber al personal. Terrence Rivera una persona de apoyo que tenga por lo menos 16 aos  para que le acompae a sus citas. Gemcitabine Injection What is this medication? GEMCITABINE (jem SYE ta been) treats some types of cancer. It works by slowing down the growth of cancer cells. This medicine may be used for other purposes; ask your health care provider or pharmacist if you have questions. COMMON BRAND NAME(S): Gemzar, Infugem What should I tell my care team before I take this medication? They need to know if you have any of these conditions: Blood disorders Infection Kidney disease Liver disease Lung or breathing disease, such as asthma or COPD Recent or ongoing radiation therapy An unusual or allergic reaction to gemcitabine, other medications, foods, dyes, or preservatives If you or your partner are pregnant or trying to get pregnant Breast-feeding How should I use this medication? This medication is injected into a vein. It is given by your care team in a hospital or clinic setting. Talk to your care team about the use of this medication in children. Special care may be needed. Overdosage: If you think you have taken too much of this medicine contact a poison control center or emergency room at once. NOTE: This medicine is only for you. Do not share this medicine with others. What if I miss a dose? Keep appointments for follow-up doses. It is important not to miss your dose. Call your care team if you are unable to keep an appointment. What may interact with this medication? Interactions have not been studied. This list may not describe all possible interactions. Give your health care provider a list of all the medicines, herbs, non-prescription drugs, or dietary supplements you use. Also tell them if you smoke, drink alcohol, or use illegal drugs. Some items may interact with your medicine. What should I watch for while using this medication? Your condition will be monitored carefully while you are receiving this medication. This medication may make you feel generally  unwell. This is not uncommon, as chemotherapy can affect healthy cells as well as cancer cells. Report any side effects. Continue your course of treatment even though you feel ill unless your care team tells you to stop. In some cases, you may be given additional medications to help with side effects. Follow all directions for their use. This medication may increase your risk of getting an infection. Call your care team for advice if you get a fever, chills, sore throat, or other symptoms of a cold or flu. Do not treat yourself. Try to avoid being around people who are sick. This medication may increase your risk to bruise or bleed. Call your care team if you notice any unusual bleeding. Be careful brushing or flossing your teeth or using a toothpick because you may get an infection or bleed more easily. If you have any dental work done, tell your dentist you are receiving this medication. Avoid taking medications that contain aspirin, acetaminophen, ibuprofen, naproxen, or ketoprofen unless instructed by your care team. These medications may hide a fever. Talk to your care team if you or your partner wish to become pregnant or think you might be pregnant. This medication can cause serious birth defects if taken during pregnancy and for 6 months after the last  dose. A negative pregnancy test is required before starting this medication. A reliable form of contraception is recommended while taking this medication and for 6 months after the last dose. Talk to your care team about effective forms of contraception. Do not father a child while taking this medication and for 3 months after the last dose. Use a condom while having sex during this time period. Do not breastfeed while taking this medication and for at least 1 week after the last dose. This medication may cause infertility. Talk to your care team if you are concerned about your fertility. What side effects may I notice from receiving this  medication? Side effects that you should report to your care team as soon as possible: Allergic reactions--skin rash, itching, hives, swelling of the face, lips, tongue, or throat Capillary leak syndrome--stomach or muscle pain, unusual weakness or fatigue, feeling faint or lightheaded, decrease in the amount of urine, swelling of the ankles, hands, or feet, trouble breathing Infection--fever, chills, cough, sore throat, wounds that don't heal, pain or trouble when passing urine, general feeling of discomfort or being unwell Liver injury--right upper belly pain, loss of appetite, nausea, light-colored stool, dark yellow or brown urine, yellowing skin or eyes, unusual weakness or fatigue Low red blood cell level--unusual weakness or fatigue, dizziness, headache, trouble breathing Lung injury--shortness of breath or trouble breathing, cough, spitting up blood, chest pain, fever Stomach pain, bloody diarrhea, pale skin, unusual weakness or fatigue, decrease in the amount of urine, which may be signs of hemolytic uremic syndrome Sudden and severe headache, confusion, change in vision, seizures, which may be signs of posterior reversible encephalopathy syndrome (PRES) Unusual bruising or bleeding Side effects that usually do not require medical attention (report to your care team if they continue or are bothersome): Diarrhea Drowsiness Hair loss Nausea Pain, redness, or swelling with sores inside the mouth or throat Vomiting This list may not describe all possible side effects. Call your doctor for medical advice about side effects. You may report side effects to FDA at 1-800-FDA-1088. Where should I keep my medication? This medication is given in a hospital or clinic. It will not be stored at home. NOTE: This sheet is a summary. It may not cover all possible information. If you have questions about this medicine, talk to your doctor, pharmacist, or health care provider.  2023 Elsevier/Gold  Standard (2007-04-15 00:00:00)

## 2022-01-15 ENCOUNTER — Other Ambulatory Visit: Payer: Self-pay

## 2022-01-21 ENCOUNTER — Inpatient Hospital Stay: Payer: Medicare (Managed Care)

## 2022-01-21 ENCOUNTER — Other Ambulatory Visit: Payer: Self-pay

## 2022-01-21 ENCOUNTER — Encounter: Payer: Self-pay | Admitting: Oncology

## 2022-01-21 VITALS — BP 142/77 | HR 87 | Temp 97.7°F | Resp 18 | Wt 147.5 lb

## 2022-01-21 DIAGNOSIS — Z95828 Presence of other vascular implants and grafts: Secondary | ICD-10-CM | POA: Insufficient documentation

## 2022-01-21 DIAGNOSIS — C679 Malignant neoplasm of bladder, unspecified: Secondary | ICD-10-CM

## 2022-01-21 DIAGNOSIS — C673 Malignant neoplasm of anterior wall of bladder: Secondary | ICD-10-CM

## 2022-01-21 DIAGNOSIS — Z5111 Encounter for antineoplastic chemotherapy: Secondary | ICD-10-CM | POA: Diagnosis not present

## 2022-01-21 LAB — CMP (CANCER CENTER ONLY)
ALT: 27 U/L (ref 0–44)
AST: 22 U/L (ref 15–41)
Albumin: 3.3 g/dL — ABNORMAL LOW (ref 3.5–5.0)
Alkaline Phosphatase: 73 U/L (ref 38–126)
Anion gap: 9 (ref 5–15)
BUN: 24 mg/dL — ABNORMAL HIGH (ref 8–23)
CO2: 26 mmol/L (ref 22–32)
Calcium: 8.7 mg/dL — ABNORMAL LOW (ref 8.9–10.3)
Chloride: 106 mmol/L (ref 98–111)
Creatinine: 1.4 mg/dL — ABNORMAL HIGH (ref 0.61–1.24)
GFR, Estimated: 51 mL/min — ABNORMAL LOW (ref >60)
Glucose, Bld: 195 mg/dL — ABNORMAL HIGH (ref 70–99)
Potassium: 3.7 mmol/L (ref 3.5–5.1)
Sodium: 141 mmol/L (ref 135–145)
Total Bilirubin: 0.4 mg/dL (ref 0.3–1.2)
Total Protein: 7.1 g/dL (ref 6.5–8.1)

## 2022-01-21 LAB — CBC WITH DIFFERENTIAL (CANCER CENTER ONLY)
Abs Immature Granulocytes: 0.53 10*3/uL — ABNORMAL HIGH (ref 0.00–0.07)
Basophils Absolute: 0 10*3/uL (ref 0.0–0.1)
Basophils Relative: 1 %
Eosinophils Absolute: 0 10*3/uL (ref 0.0–0.5)
Eosinophils Relative: 1 %
HCT: 23.4 % — ABNORMAL LOW (ref 39.0–52.0)
Hemoglobin: 7.5 g/dL — ABNORMAL LOW (ref 13.0–17.0)
Immature Granulocytes: 6 %
Lymphocytes Relative: 13 %
Lymphs Abs: 1.2 10*3/uL (ref 0.7–4.0)
MCH: 29 pg (ref 26.0–34.0)
MCHC: 32.1 g/dL (ref 30.0–36.0)
MCV: 90.3 fL (ref 80.0–100.0)
Monocytes Absolute: 1 10*3/uL (ref 0.1–1.0)
Monocytes Relative: 12 %
Neutro Abs: 5.8 10*3/uL (ref 1.7–7.7)
Neutrophils Relative %: 67 %
Platelet Count: 356 10*3/uL (ref 150–400)
RBC: 2.59 MIL/uL — ABNORMAL LOW (ref 4.22–5.81)
RDW: 23.1 % — ABNORMAL HIGH (ref 11.5–15.5)
WBC Count: 8.6 10*3/uL (ref 4.0–10.5)
nRBC: 0.5 % — ABNORMAL HIGH (ref 0.0–0.2)

## 2022-01-21 MED ORDER — SODIUM CHLORIDE 0.9 % IV SOLN
800.0000 mg/m2 | Freq: Once | INTRAVENOUS | Status: AC
  Administered 2022-01-21: 1444 mg via INTRAVENOUS
  Filled 2022-01-21: qty 37.98

## 2022-01-21 MED ORDER — PROCHLORPERAZINE MALEATE 10 MG PO TABS
10.0000 mg | ORAL_TABLET | Freq: Once | ORAL | Status: AC
  Administered 2022-01-21: 10 mg via ORAL
  Filled 2022-01-21: qty 1

## 2022-01-21 MED ORDER — SODIUM CHLORIDE 0.9% FLUSH
10.0000 mL | Freq: Once | INTRAVENOUS | Status: AC
  Administered 2022-01-21: 10 mL

## 2022-01-21 MED ORDER — SODIUM CHLORIDE 0.9 % IV SOLN: Freq: Once | INTRAVENOUS | Status: AC

## 2022-01-21 MED ORDER — HEPARIN SOD (PORK) LOCK FLUSH 100 UNIT/ML IV SOLN
500.0000 [IU] | Freq: Once | INTRAVENOUS | Status: AC | PRN
  Administered 2022-01-21: 500 [IU]

## 2022-01-21 MED ORDER — SODIUM CHLORIDE 0.9% FLUSH
10.0000 mL | INTRAVENOUS | Status: DC | PRN
  Administered 2022-01-21: 10 mL

## 2022-01-21 NOTE — Progress Notes (Signed)
Per Dr Alen Blew, ok to treat with hgb 7.5 g/dL

## 2022-01-21 NOTE — Patient Instructions (Signed)
Instrucciones al darle de alta: Discharge Instructions Gracias por elegir al [facility] de Cncer de [place] para brindarle atencin mdica de oncologa y Music therapist.   Si usted tiene una cita de laboratorio con [place], por favor vaya directamente [place] y regstrese en el rea de Control and instrumentation engineer.   Use ropa cmoda y [place] para tener fcil acceso a las vas del Portacath (acceso venoso de Engineer, site duracin) o la lnea PICC (catter central colocado por va perifrica).   Nos esforzamos por ofrecerle tiempo de calidad con su proveedor. Es posible que tenga que volver a programar su cita si llega tarde (15 minutos o ms).  El llegar tarde le afecta a usted y a otros pacientes cuyas citas son posteriores a Merchandiser, retail.  Adems, si usted falta a tres o ms citas sin avisar a la oficina, puede ser retirado(a) de la clnica a discrecin del proveedor.      Para las solicitudes de renovacin de recetas, pida a su farmacia que se ponga en contacto con nuestra oficina y deje que transcurran 67 horas para que se complete el proceso de las renovaciones.    Hoy usted recibi los siguientes agentes de quimioterapia e/o inmunoterapia; Gemzar      Para ayudar a prevenir las nuseas y los vmitos despus de su tratamiento, le recomendamos que tome su medicamento para las nuseas segn las indicaciones.  LOS SNTOMAS QUE DEBEN COMUNICARSE INMEDIATAMENTE SE INDICAN A CONTINUACIN: *FIEBRE SUPERIOR A 100.4 F (38 C) O MS *ESCALOFROS O SUDORACIN *NUSEAS Y VMITOS QUE NO SE CONTROLAN CON EL MEDICAMENTO PARA LAS NUSEAS *DIFICULTAD INUSUAL PARA RESPIRAR  *MORETONES O HEMORRAGIAS NO HABITUALES *PROBLEMAS URINARIOS (dolor o ardor al Garment/textile technologist o frecuencia para Garment/textile technologist) *PROBLEMAS INTESTINALES (diarrea inusual, estreimiento, dolor cerca del ano) SENSIBILIDAD EN LA BOCA Y EN LA GARGANTA CON O SIN LA PRESENCIA DE LCERAS (dolor de garganta, llagas en la boca o dolor de muelas/dientes) ERUPCIN,  HINCHAZN O DOLORES INUSUALES FLUJO VAGINAL INUSUAL O PICAZN/RASQUIA    Los puntos marcados con un asterisco ( *) indican una posible emergencia y debe hacer un seguimiento tan pronto como le sea posible o vaya al Departamento de Emergencias si se le presenta algn problema.  Por favor, muestre la [place] DE ADVERTENCIA DE Windy Canny DE ADVERTENCIA DE Benay Spice al registrarse en [address] de Emergencias y a la enfermera de triaje.  Si tiene preguntas despus de su visita o necesita cancelar o volver a programar su cita, por favor pngase en contacto con [place]  Dept: [phone] y [place]. Las horas de oficina son de 8:00 a.m. a 4:30 p.m. de lunes a viernes. Por favor, tenga en cuenta que los mensajes de voz que se dejan despus de las 4:00 p.m. posiblemente no se devolvern hasta el siguiente da de [place].  Cerramos los fines de semana y [employer]. En todo momento tiene acceso a una enfermera para preguntas urgentes. Por favor, llame al nmero principal de la clnica Dept: [phone] y [place] instrucciones.   Para cualquier pregunta que no sea de carcter urgente, tambin puede ponerse en contacto con su proveedor [employer]. Ahora ofrecemos visitas electrnicas para cualquier persona mayor de 18 aos que solicite atencin mdica en lnea para los sntomas que no sean urgentes. Para ms detalles vaya a mychart.[email or web address].   Tambin puede bajar la aplicacin de MyChart! Vaya a la tienda de aplicaciones, busque "MyChart", abra la aplicacin, seleccione  [place], e ingrese con su nombre de usuario y la contrasea de Pharmacist, community.  Las mscaras son opcionales en los centros de Hotel manager. Si desea que su equipo de cuidados mdicos use una [employer] atienden, por favor hgaselo saber al personal. Bethann Berkshire una persona de apoyo que tenga por lo menos 16 aos para que le acompae a sus  citas.

## 2022-01-25 ENCOUNTER — Inpatient Hospital Stay (HOSPITAL_COMMUNITY)
Admission: EM | Admit: 2022-01-25 | Discharge: 2022-01-27 | DRG: 811 | Disposition: A | Discharge status: Home / Self Care | Payer: Medicare (Managed Care) | Attending: Internal Medicine | Admitting: Internal Medicine

## 2022-01-25 ENCOUNTER — Other Ambulatory Visit: Payer: Self-pay

## 2022-01-25 ENCOUNTER — Emergency Department (HOSPITAL_COMMUNITY): Payer: Medicare (Managed Care)

## 2022-01-25 ENCOUNTER — Emergency Department (HOSPITAL_BASED_OUTPATIENT_CLINIC_OR_DEPARTMENT_OTHER): Payer: Medicare (Managed Care)

## 2022-01-25 DIAGNOSIS — T451X5A Adverse effect of antineoplastic and immunosuppressive drugs, initial encounter: Secondary | ICD-10-CM | POA: Diagnosis present

## 2022-01-25 DIAGNOSIS — I82403 Acute embolism and thrombosis of unspecified deep veins of lower extremity, bilateral: Secondary | ICD-10-CM | POA: Diagnosis present

## 2022-01-25 DIAGNOSIS — Z6822 Body mass index (BMI) 22.0-22.9, adult: Secondary | ICD-10-CM

## 2022-01-25 DIAGNOSIS — D649 Anemia, unspecified: Secondary | ICD-10-CM | POA: Diagnosis present

## 2022-01-25 DIAGNOSIS — E782 Mixed hyperlipidemia: Secondary | ICD-10-CM | POA: Diagnosis present

## 2022-01-25 DIAGNOSIS — Z79899 Other long term (current) drug therapy: Secondary | ICD-10-CM

## 2022-01-25 DIAGNOSIS — C61 Malignant neoplasm of prostate: Secondary | ICD-10-CM | POA: Diagnosis present

## 2022-01-25 DIAGNOSIS — C679 Malignant neoplasm of bladder, unspecified: Secondary | ICD-10-CM | POA: Diagnosis present

## 2022-01-25 DIAGNOSIS — I110 Hypertensive heart disease with heart failure: Secondary | ICD-10-CM | POA: Diagnosis present

## 2022-01-25 DIAGNOSIS — E876 Hypokalemia: Secondary | ICD-10-CM | POA: Diagnosis present

## 2022-01-25 DIAGNOSIS — I10 Essential (primary) hypertension: Secondary | ICD-10-CM | POA: Diagnosis present

## 2022-01-25 DIAGNOSIS — L8990 Pressure ulcer of unspecified site, unspecified stage: Secondary | ICD-10-CM | POA: Diagnosis present

## 2022-01-25 DIAGNOSIS — E1165 Type 2 diabetes mellitus with hyperglycemia: Secondary | ICD-10-CM | POA: Diagnosis present

## 2022-01-25 DIAGNOSIS — E43 Unspecified severe protein-calorie malnutrition: Secondary | ICD-10-CM | POA: Diagnosis present

## 2022-01-25 DIAGNOSIS — R7989 Other specified abnormal findings of blood chemistry: Secondary | ICD-10-CM | POA: Diagnosis present

## 2022-01-25 DIAGNOSIS — I82463 Acute embolism and thrombosis of calf muscular vein, bilateral: Secondary | ICD-10-CM | POA: Diagnosis present

## 2022-01-25 DIAGNOSIS — M7989 Other specified soft tissue disorders: Secondary | ICD-10-CM

## 2022-01-25 DIAGNOSIS — Y848 Other medical procedures as the cause of abnormal reaction of the patient, or of later complication, without mention of misadventure at the time of the procedure: Secondary | ICD-10-CM | POA: Diagnosis present

## 2022-01-25 DIAGNOSIS — D6481 Anemia due to antineoplastic chemotherapy: Secondary | ICD-10-CM | POA: Diagnosis present

## 2022-01-25 DIAGNOSIS — C7951 Secondary malignant neoplasm of bone: Secondary | ICD-10-CM | POA: Diagnosis present

## 2022-01-25 DIAGNOSIS — J9601 Acute respiratory failure with hypoxia: Secondary | ICD-10-CM | POA: Diagnosis present

## 2022-01-25 DIAGNOSIS — R9431 Abnormal electrocardiogram [ECG] [EKG]: Secondary | ICD-10-CM | POA: Diagnosis present

## 2022-01-25 DIAGNOSIS — Z7901 Long term (current) use of anticoagulants: Secondary | ICD-10-CM

## 2022-01-25 DIAGNOSIS — T8089XA Other complications following infusion, transfusion and therapeutic injection, initial encounter: Principal | ICD-10-CM | POA: Diagnosis present

## 2022-01-25 DIAGNOSIS — R531 Weakness: Secondary | ICD-10-CM | POA: Diagnosis not present

## 2022-01-25 DIAGNOSIS — I5033 Acute on chronic diastolic (congestive) heart failure: Secondary | ICD-10-CM | POA: Diagnosis present

## 2022-01-25 LAB — CBC WITH DIFFERENTIAL/PLATELET
Abs Immature Granulocytes: 0.32 K/uL — ABNORMAL HIGH (ref 0.00–0.07)
Basophils Absolute: 0 K/uL (ref 0.0–0.1)
Basophils Relative: 0 %
Eosinophils Absolute: 0.1 K/uL (ref 0.0–0.5)
Eosinophils Relative: 1 %
HCT: 21.1 % — ABNORMAL LOW (ref 39.0–52.0)
Hemoglobin: 6.3 g/dL — CL (ref 13.0–17.0)
Immature Granulocytes: 3 %
Lymphocytes Relative: 6 %
Lymphs Abs: 0.8 K/uL (ref 0.7–4.0)
MCH: 28.3 pg (ref 26.0–34.0)
MCHC: 29.9 g/dL — ABNORMAL LOW (ref 30.0–36.0)
MCV: 94.6 fL (ref 80.0–100.0)
Monocytes Absolute: 0.3 K/uL (ref 0.1–1.0)
Monocytes Relative: 2 %
Neutro Abs: 11.1 K/uL — ABNORMAL HIGH (ref 1.7–7.7)
Neutrophils Relative %: 88 %
Platelets: 184 K/uL (ref 150–400)
RBC: 2.23 MIL/uL — ABNORMAL LOW (ref 4.22–5.81)
RDW: 21.6 % — ABNORMAL HIGH (ref 11.5–15.5)
WBC: 12.5 K/uL — ABNORMAL HIGH (ref 4.0–10.5)
nRBC: 0 % (ref 0.0–0.2)

## 2022-01-25 LAB — COMPREHENSIVE METABOLIC PANEL
ALT: 23 U/L (ref 0–44)
AST: 26 U/L (ref 15–41)
Albumin: 2.5 g/dL — ABNORMAL LOW (ref 3.5–5.0)
Alkaline Phosphatase: 60 U/L (ref 38–126)
Anion gap: 9 (ref 5–15)
BUN: 28 mg/dL — ABNORMAL HIGH (ref 8–23)
CO2: 23 mmol/L (ref 22–32)
Calcium: 8.1 mg/dL — ABNORMAL LOW (ref 8.9–10.3)
Chloride: 105 mmol/L (ref 98–111)
Creatinine, Ser: 1.41 mg/dL — ABNORMAL HIGH (ref 0.61–1.24)
GFR, Estimated: 51 mL/min — ABNORMAL LOW (ref >60)
Glucose, Bld: 242 mg/dL — ABNORMAL HIGH (ref 70–99)
Potassium: 3.1 mmol/L — ABNORMAL LOW (ref 3.5–5.1)
Sodium: 137 mmol/L (ref 135–145)
Total Bilirubin: 0.5 mg/dL (ref 0.3–1.2)
Total Protein: 6.5 g/dL (ref 6.5–8.1)

## 2022-01-25 LAB — BRAIN NATRIURETIC PEPTIDE: B Natriuretic Peptide: 617.9 pg/mL — ABNORMAL HIGH (ref 0.0–100.0)

## 2022-01-25 LAB — ABO/RH: ABO/RH(D): O NEG

## 2022-01-25 LAB — PREPARE RBC (CROSSMATCH)

## 2022-01-25 MED ORDER — APIXABAN (ELIQUIS) VTE STARTER PACK (10MG AND 5MG): ORAL_TABLET | ORAL | 0 refills | Status: DC

## 2022-01-25 MED ORDER — SODIUM CHLORIDE 0.9 % IV SOLN
10.0000 mL/h | Freq: Once | INTRAVENOUS | Status: AC
  Administered 2022-01-25: 10 mL/h via INTRAVENOUS

## 2022-01-25 MED ORDER — FUROSEMIDE 10 MG/ML IJ SOLN
20.0000 mg | Freq: Once | INTRAMUSCULAR | Status: AC
  Administered 2022-01-25: 20 mg via INTRAVENOUS
  Filled 2022-01-25: qty 4

## 2022-01-25 MED ORDER — APIXABAN 5 MG PO TABS
10.0000 mg | ORAL_TABLET | Freq: Once | ORAL | Status: AC
  Administered 2022-01-25: 10 mg via ORAL
  Filled 2022-01-25: qty 2

## 2022-01-25 MED ORDER — APIXABAN (ELIQUIS) EDUCATION KIT FOR DVT/PE PATIENTS: PACK | Freq: Once | Status: DC

## 2022-01-25 NOTE — Progress Notes (Signed)
Bilateral lower extremity venous duplex has been completed. Preliminary results can be found in CV Proc through chart review.  Results were given to Cherlynn June PA.  01/25/22 4:15 PM Carlos Levering RVT

## 2022-01-25 NOTE — ED Triage Notes (Signed)
Pt arrived via POV. Pt began experiencing edema in bilat LL. +3 w/redness. Some pain when ambulating.   Pt has prostate cx, last chemo 4 days ago.  AOx4

## 2022-01-25 NOTE — ED Notes (Signed)
Pt wants blood taken from port.

## 2022-01-25 NOTE — ED Provider Notes (Signed)
[facility] DEPT Provider Note   CSN: [id number] Arrival date & time: 01/25/22  1423     History  Chief Complaint  Patient presents with   Leg Swelling    Terrence Rivera is a 81 y.o. male.  Patient is a 81 year old male with a history of hypertension, prostate cancer currently getting chemotherapy who is presenting today with a 3-day history of worsening bilateral leg pain and swelling.  Patient denies any chest pain or shortness of breath.  He denies any prolonged immobilization or travel.  No recent surgeries.  He has not had any diarrhea and has been urinating okay.  No prior history of PE or DVT.  He is not anticoagulated.  The history is provided by the patient.       Home Medications Prior to Admission medications   Medication Sig Start Date End Date Taking? Authorizing Provider  lidocaine-prilocaine (EMLA) cream Apply 1 Application topically as needed. 10/30/21   Wyatt Portela, MD  lisinopril (ZESTRIL) 20 MG tablet Take 1 tablet (20 mg total) by mouth daily. 06/14/19   Cardama, Grayce Sessions, MD  oxyCODONE (OXY IR/ROXICODONE) 5 MG immediate release tablet Take 1 tablet (5 mg total) by mouth every 4 (four) hours as needed for moderate pain. Patient not taking: Reported on 12/01/2021 09/24/21   Lucas Mallow, MD  prochlorperazine (COMPAZINE) 10 MG tablet Take 1 tablet (10 mg total) by mouth every 6 (six) hours as needed for nausea or vomiting. 10/30/21   Wyatt Portela, MD      Allergies    Patient has no known allergies.    Review of Systems   Review of Systems  Physical Exam Updated Vital Signs BP 130/72   Pulse 83   Temp 98.5 F (36.9 C)   Resp 19   SpO2 97%  Physical Exam Vitals and nursing note reviewed.  Constitutional:      General: He is not in acute distress.    Appearance: He is well-developed.  HENT:     Head: Normocephalic and atraumatic.  Eyes:     Conjunctiva/sclera: Conjunctivae normal.     Pupils:  Pupils are equal, round, and reactive to light.  Cardiovascular:     Rate and Rhythm: Normal rate and regular rhythm.     Heart sounds: No murmur heard. Pulmonary:     Effort: Pulmonary effort is normal. No respiratory distress.     Breath sounds: Normal breath sounds. No wheezing or rales.  Abdominal:     General: There is no distension.     Palpations: Abdomen is soft.     Tenderness: There is no abdominal tenderness. There is no guarding or rebound.  Genitourinary:    Comments: Minimal skin breakdown, erythema and excoriation in the gluteal cleft Musculoskeletal:        General: No tenderness. Normal range of motion.     Cervical back: Normal range of motion and neck supple.     Right lower leg: Edema present.     Left lower leg: Edema present.     Comments: 2+ nonpitting edema present in bilateral lower extremities below the knee with warmth and mild erythema in bilateral feet.  Capillary refill is less than 3 seconds.  1+ DP pulse bilaterally.  Tenderness with palpation of bilateral calves  Skin:    General: Skin is warm and dry.     Findings: No erythema or rash.  Neurological:     Mental Status: He is alert and  oriented to person, place, and time. Mental status is at baseline.  Psychiatric:        Mood and Affect: Mood normal.        Behavior: Behavior normal.     ED Results / Procedures / Treatments   Labs (all labs ordered are listed, but only abnormal results are displayed) Labs Reviewed  CBC WITH DIFFERENTIAL/PLATELET - Abnormal; Notable for the following components:      Result Value   WBC 12.5 (*)    RBC 2.23 (*)    Hemoglobin 6.3 (*)    HCT 21.1 (*)    MCHC 29.9 (*)    RDW 21.6 (*)    Neutro Abs 11.1 (*)    Abs Immature Granulocytes 0.32 (*)    All other components within normal limits  COMPREHENSIVE METABOLIC PANEL - Abnormal; Notable for the following components:   Potassium 3.1 (*)    Glucose, Bld 242 (*)    BUN 28 (*)    Creatinine, Ser 1.41 (*)     Calcium 8.1 (*)    Albumin 2.5 (*)    GFR, Estimated 51 (*)    All other components within normal limits  BRAIN NATRIURETIC PEPTIDE  TYPE AND SCREEN  PREPARE RBC (CROSSMATCH)    EKG EKG Interpretation  Date/Time:  Monday January 25 2022 15:02:47 EST Ventricular Rate:  93 PR Interval:  125 QRS Duration: 84 QT Interval:  366 QTC Calculation: 456 R Axis:   13 Text Interpretation: Sinus rhythm Ventricular premature complex Abnormal R-wave progression, early transition Left ventricular hypertrophy No significant change since last tracing Confirmed by Blanchie Dessert [phone]) on 01/25/2022 4:12:56 PM  Radiology VAS [place] LOWER EXTREMITY VENOUS (DVT) (ONLY [facility] & [facility])  Result Date: 01/25/2022  Lower Venous DVT Study Patient Name:  Terrence Rivera  Date of Exam:   01/25/2022 Medical Rec #: [id number]              Accession #:    [id number] Date of Birth: 1941/02/19               Patient Gender: M Patient Age:   27 years Exam Location:  [facility] Procedure:      VAS [place] LOWER EXTREMITY VENOUS (DVT) Referring Phys: Cherlynn June --------------------------------------------------------------------------------  Indications: Swelling.  Risk Factors: None identified. Limitations: Poor ultrasound/tissue interface and patient positioning. Comparison Study: No prior studies. Performing Technologist: Oliver Hum RVT  Examination Guidelines: A complete evaluation includes B-mode imaging, spectral Doppler, color Doppler, and power Doppler as needed of all accessible portions of each vessel. Bilateral testing is considered an integral part of a complete examination. Limited examinations for reoccurring indications may be performed as noted. The reflux portion of the exam is performed with the patient in reverse Trendelenburg.  +---------+---------------+---------+-----------+----------+--------------+ RIGHT    CompressibilityPhasicitySpontaneityPropertiesThrombus Aging  +---------+---------------+---------+-----------+----------+--------------+ CFV      Full           Yes      No                                  +---------+---------------+---------+-----------+----------+--------------+ SFJ      Full                                                        +---------+---------------+---------+-----------+----------+--------------+  FV Prox  Full                                                        +---------+---------------+---------+-----------+----------+--------------+ FV Mid   Full                                                        +---------+---------------+---------+-----------+----------+--------------+ FV DistalFull                                                        +---------+---------------+---------+-----------+----------+--------------+ PFV      Full                                                        +---------+---------------+---------+-----------+----------+--------------+ POP      Full           Yes      No                                  +---------+---------------+---------+-----------+----------+--------------+ PTV      Full                                                        +---------+---------------+---------+-----------+----------+--------------+ PERO     Full                                                        +---------+---------------+---------+-----------+----------+--------------+ Gastroc  None                                         Acute          +---------+---------------+---------+-----------+----------+--------------+   +---------+---------------+---------+-----------+----------+--------------+ LEFT     CompressibilityPhasicitySpontaneityPropertiesThrombus Aging +---------+---------------+---------+-----------+----------+--------------+ CFV      Full           Yes      No                                   +---------+---------------+---------+-----------+----------+--------------+ SFJ      Full                                                        +---------+---------------+---------+-----------+----------+--------------+  FV Prox  Full                                                        +---------+---------------+---------+-----------+----------+--------------+ FV Mid   Full                                                        +---------+---------------+---------+-----------+----------+--------------+ FV DistalFull                                                        +---------+---------------+---------+-----------+----------+--------------+ PFV      Full                                                        +---------+---------------+---------+-----------+----------+--------------+ POP      Full           Yes      No                                  +---------+---------------+---------+-----------+----------+--------------+ PTV      Full                                                        +---------+---------------+---------+-----------+----------+--------------+ PERO     Full                                                        +---------+---------------+---------+-----------+----------+--------------+ Gastroc  Partial                                      Acute          +---------+---------------+---------+-----------+----------+--------------+     Summary: RIGHT: - Findings consistent with acute deep vein thrombosis involving the right gastrocnemius veins. - No cystic structure found in the popliteal fossa.  LEFT: - Findings consistent with acute deep vein thrombosis involving the left gastrocnemius veins. - No cystic structure found in the popliteal fossa.  *See table(s) above for measurements and observations. Electronically signed by Servando Snare MD on 01/25/2022 at 5:32:22 PM.    Final    DG Chest 2 View  Result Date:  01/25/2022 CLINICAL DATA:  Lower extremity edema. EXAM: CHEST - 2 VIEW COMPARISON:  None Available. FINDINGS: Mild cardiomegaly is noted with mild central pulmonary vascular congestion. No consolidative process is noted. Right internal jugular Port-A-Cath is noted.  Bony thorax is unremarkable. IMPRESSION: Mild cardiomegaly with mild central pulmonary vascular congestion. Electronically Signed   By: Marijo Conception M.D.   On: 01/25/2022 15:40    Procedures Procedures    Medications Ordered in ED Medications  0.9 %  sodium chloride infusion (has no administration in time range)  apixaban (ELIQUIS) Education Kit for DVT/PE patients (has no administration in time range)  apixaban (ELIQUIS) tablet 10 mg (has no administration in time range)  furosemide (LASIX) injection 20 mg (has no administration in time range)    ED Course/ Medical Decision Making/ A&P                           Medical Decision Making Amount and/or Complexity of Data Reviewed Labs: ordered.  Risk Prescription drug management.   Pt with multiple medical problems and comorbidities and presenting today with a complaint that caries a high risk for morbidity and mortality.  Here today because of 2 days of worsening swelling and pain in bilateral legs.  He denies any shortness of breath or chest pain.  Does have a history of hypertension and prostate cancer but no other medical issues.  He is not anticoagulated.  On exam patient's bilateral legs are swollen with tender calves.  He has not had fever or infectious symptoms.  No signs of abscess.  I have independently visualized and interpreted pt's images today.  DVT study today is positive for a gastrocnemius vein DVT bilaterally.  No DVTs more proximally.  I independently interpreted patient's labs and EKG.  EKG without acute findings.  Cxr does show some cardiomegaly and mild pulm edema but pt denying chest pain or SOB. Will attempt to get TED hose to put on the patient.  I  independently interpreted patient's labs and today he has worsening anemia with a hemoglobin of 6.3 from in the sevens last week most likely related to his chemotherapy.  Creatinine is at his baseline at 1.4 with normal electrolytes.  White count is 12.  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  6:59 PM Spoke with Dr. Lindi Adie and there is not contraindication for eliquis.  Will give 1 unit of PRBC and then pt can f/u with cancer center for further transfusions this week.  This plan was discussed with pt and his family member and they are ocmfortable with this plan. 11:50 PM Patient had just finished blood when he developed rigors, shortness of breath and a fever up to 101.4.  Patient is hypertensive having diffuse wheezing and became hypoxic.  He was set up and concern for flash pulmonary edema from blood transfusion.  Pt given an additional 16m of lasix.  Pt placed on bipap.  Will need admission.        Final Clinical Impression(s) / ED Diagnoses Final diagnoses:  None    Rx / DC Orders ED Discharge Orders     None         PBlanchie Dessert MD 01/25/22 2352

## 2022-01-25 NOTE — Discharge Instructions (Addendum)
Wear the compression socks as often as you can and elevate your legs to help with the swelling.  You need to call Dr. Hazeline Junker office tomorrow for follow up and you will gonna need another blood transfusion in the future.  You can start taking the blood thinner tomorrow because you already had a dose here.  You need to call your regular doctor for follow up with the blood thinner.  If you start having chest pain or shortness of breath you should return to the ER.  Information on my medicine - ELIQUIS (apixaban)  This medication education was reviewed with me or my healthcare representative as part of my discharge preparation.    Why was Eliquis prescribed for you? Eliquis was prescribed to treat blood clots that may have been found in the veins of your legs (deep vein thrombosis) or in your lungs (pulmonary embolism) and to reduce the risk of them occurring again.  What do You need to know about Eliquis ? The starting dose is 10 mg (two 5 mg tablets) taken TWICE daily for the FIRST SEVEN (7) DAYS, then on 02/01/22  the dose is reduced to ONE 5 mg tablet taken TWICE daily.  Eliquis may be taken with or without food.   Try to take the dose about the same time in the morning and in the evening. If you have difficulty swallowing the tablet whole please discuss with your pharmacist how to take the medication safely.  Take Eliquis exactly as prescribed and DO NOT stop taking Eliquis without talking to the doctor who prescribed the medication.  Stopping may increase your risk of developing a new blood clot.  Refill your prescription before you run out.  After discharge, you should have regular check-up appointments with your healthcare provider that is prescribing your Eliquis.    What do you do if you miss a dose? If a dose of ELIQUIS is not taken at the scheduled time, take it as soon as possible on the same day and twice-daily administration should be resumed. The dose should not be doubled  to make up for a missed dose.  Important Safety Information A possible side effect of Eliquis is bleeding. You should call your healthcare provider right away if you experience any of the following: Bleeding from an injury or your nose that does not stop. Unusual colored urine (red or dark brown) or unusual colored stools (red or black). Unusual bruising for unknown reasons. A serious fall or if you hit your head (even if there is no bleeding).  Some medicines may interact with Eliquis and might increase your risk of bleeding or clotting while on Eliquis. To help avoid this, consult your healthcare provider or pharmacist prior to using any new prescription or non-prescription medications, including herbals, vitamins, non-steroidal anti-inflammatory drugs (NSAIDs) and supplements.  This website has more information on Eliquis (apixaban): http://www.eliquis.com/eliquis/home

## 2022-01-25 NOTE — Progress Notes (Signed)
Patient got reaction after blood transfusion ended. Pt increase SOB. Wheezy and crackle upon auscultation. EDP at bedside. Respiratory called to start BIPAP. Will continue to monitor.

## 2022-01-25 NOTE — ED Provider Triage Note (Signed)
Emergency Medicine Provider Triage Evaluation Note  Terrence Rivera , a 81 y.o. male  was evaluated in triage.  Pt complains of bilateral lower extremity swelling.  Patient also complains of some erythema in the lower extremities bilaterally.  Edema is +3 pitting.  He states it began over the past few days.  He states it was a gradual onset to the swelling.  Patient does have prostate cancer and had his last chemotherapy treatment 4 days ago.  He does endorse some mild lower extremity pain with ambulation.  Patient currently denies shortness of breath, chest pain, nausea, vomiting  Review of Systems  Positive: As above Negative: As above  Physical Exam  BP (!) 136/58   Pulse 94   Temp 98.8 F (37.1 C) (Oral)   Resp 16   SpO2 100%  Gen:   Awake, no distress   Resp:  Normal effort  MSK:   Moves extremities without difficulty  Other:  +3 pitting edema bilateral lower extremities  Medical Decision Making  Medically screening exam initiated at 3:14 PM.  Appropriate orders placed.  Terrence Rivera was informed that the remainder of the evaluation will be completed by another provider, this initial triage assessment does not replace that evaluation, and the importance of remaining in the ED until their evaluation is complete.     Dorothyann Peng, PA-C 01/25/22 1516

## 2022-01-26 ENCOUNTER — Other Ambulatory Visit: Payer: Self-pay

## 2022-01-26 DIAGNOSIS — R531 Weakness: Secondary | ICD-10-CM | POA: Diagnosis present

## 2022-01-26 DIAGNOSIS — D6481 Anemia due to antineoplastic chemotherapy: Secondary | ICD-10-CM | POA: Diagnosis present

## 2022-01-26 DIAGNOSIS — E876 Hypokalemia: Secondary | ICD-10-CM | POA: Diagnosis present

## 2022-01-26 DIAGNOSIS — T451X5A Adverse effect of antineoplastic and immunosuppressive drugs, initial encounter: Secondary | ICD-10-CM | POA: Diagnosis present

## 2022-01-26 DIAGNOSIS — J9601 Acute respiratory failure with hypoxia: Secondary | ICD-10-CM | POA: Diagnosis present

## 2022-01-26 DIAGNOSIS — E782 Mixed hyperlipidemia: Secondary | ICD-10-CM | POA: Diagnosis present

## 2022-01-26 DIAGNOSIS — Z79899 Other long term (current) drug therapy: Secondary | ICD-10-CM | POA: Diagnosis not present

## 2022-01-26 DIAGNOSIS — I110 Hypertensive heart disease with heart failure: Secondary | ICD-10-CM | POA: Diagnosis present

## 2022-01-26 DIAGNOSIS — I5033 Acute on chronic diastolic (congestive) heart failure: Secondary | ICD-10-CM | POA: Diagnosis present

## 2022-01-26 DIAGNOSIS — C679 Malignant neoplasm of bladder, unspecified: Secondary | ICD-10-CM | POA: Diagnosis present

## 2022-01-26 DIAGNOSIS — T8089XA Other complications following infusion, transfusion and therapeutic injection, initial encounter: Secondary | ICD-10-CM | POA: Diagnosis present

## 2022-01-26 DIAGNOSIS — E43 Unspecified severe protein-calorie malnutrition: Secondary | ICD-10-CM | POA: Diagnosis present

## 2022-01-26 DIAGNOSIS — Z7901 Long term (current) use of anticoagulants: Secondary | ICD-10-CM | POA: Diagnosis not present

## 2022-01-26 DIAGNOSIS — E1165 Type 2 diabetes mellitus with hyperglycemia: Secondary | ICD-10-CM | POA: Diagnosis present

## 2022-01-26 DIAGNOSIS — L8990 Pressure ulcer of unspecified site, unspecified stage: Secondary | ICD-10-CM | POA: Diagnosis present

## 2022-01-26 DIAGNOSIS — M5431 Sciatica, right side: Secondary | ICD-10-CM | POA: Insufficient documentation

## 2022-01-26 DIAGNOSIS — I10 Essential (primary) hypertension: Secondary | ICD-10-CM | POA: Diagnosis present

## 2022-01-26 DIAGNOSIS — R7989 Other specified abnormal findings of blood chemistry: Secondary | ICD-10-CM | POA: Diagnosis present

## 2022-01-26 DIAGNOSIS — R9431 Abnormal electrocardiogram [ECG] [EKG]: Secondary | ICD-10-CM | POA: Diagnosis present

## 2022-01-26 DIAGNOSIS — C7951 Secondary malignant neoplasm of bone: Secondary | ICD-10-CM | POA: Diagnosis present

## 2022-01-26 DIAGNOSIS — D649 Anemia, unspecified: Secondary | ICD-10-CM | POA: Diagnosis present

## 2022-01-26 DIAGNOSIS — I82463 Acute embolism and thrombosis of calf muscular vein, bilateral: Secondary | ICD-10-CM | POA: Diagnosis present

## 2022-01-26 DIAGNOSIS — Y848 Other medical procedures as the cause of abnormal reaction of the patient, or of later complication, without mention of misadventure at the time of the procedure: Secondary | ICD-10-CM | POA: Diagnosis present

## 2022-01-26 DIAGNOSIS — Z6822 Body mass index (BMI) 22.0-22.9, adult: Secondary | ICD-10-CM | POA: Diagnosis not present

## 2022-01-26 DIAGNOSIS — I82403 Acute embolism and thrombosis of unspecified deep veins of lower extremity, bilateral: Secondary | ICD-10-CM | POA: Diagnosis present

## 2022-01-26 DIAGNOSIS — C61 Malignant neoplasm of prostate: Secondary | ICD-10-CM | POA: Diagnosis present

## 2022-01-26 LAB — BASIC METABOLIC PANEL
Anion gap: 8 (ref 5–15)
BUN: 26 mg/dL — ABNORMAL HIGH (ref 8–23)
CO2: 26 mmol/L (ref 22–32)
Calcium: 8 mg/dL — ABNORMAL LOW (ref 8.9–10.3)
Chloride: 103 mmol/L (ref 98–111)
Creatinine, Ser: 1.31 mg/dL — ABNORMAL HIGH (ref 0.61–1.24)
GFR, Estimated: 55 mL/min — ABNORMAL LOW (ref >60)
Glucose, Bld: 194 mg/dL — ABNORMAL HIGH (ref 70–99)
Potassium: 3.5 mmol/L (ref 3.5–5.1)
Sodium: 137 mmol/L (ref 135–145)

## 2022-01-26 LAB — COMPREHENSIVE METABOLIC PANEL
ALT: 27 U/L (ref 0–44)
AST: 35 U/L (ref 15–41)
Albumin: 2.4 g/dL — ABNORMAL LOW (ref 3.5–5.0)
Alkaline Phosphatase: 57 U/L (ref 38–126)
Anion gap: 9 (ref 5–15)
BUN: 25 mg/dL — ABNORMAL HIGH (ref 8–23)
CO2: 25 mmol/L (ref 22–32)
Calcium: 8.2 mg/dL — ABNORMAL LOW (ref 8.9–10.3)
Chloride: 104 mmol/L (ref 98–111)
Creatinine, Ser: 1.43 mg/dL — ABNORMAL HIGH (ref 0.61–1.24)
GFR, Estimated: 50 mL/min — ABNORMAL LOW (ref >60)
Glucose, Bld: 176 mg/dL — ABNORMAL HIGH (ref 70–99)
Potassium: 2.9 mmol/L — ABNORMAL LOW (ref 3.5–5.1)
Sodium: 138 mmol/L (ref 135–145)
Total Bilirubin: 0.8 mg/dL (ref 0.3–1.2)
Total Protein: 6.5 g/dL (ref 6.5–8.1)

## 2022-01-26 LAB — CBC WITH DIFFERENTIAL/PLATELET
Abs Immature Granulocytes: 0.09 10*3/uL — ABNORMAL HIGH (ref 0.00–0.07)
Basophils Absolute: 0 10*3/uL (ref 0.0–0.1)
Basophils Relative: 0 %
Eosinophils Absolute: 0 10*3/uL (ref 0.0–0.5)
Eosinophils Relative: 0 %
HCT: 23.6 % — ABNORMAL LOW (ref 39.0–52.0)
Hemoglobin: 7.5 g/dL — ABNORMAL LOW (ref 13.0–17.0)
Immature Granulocytes: 1 %
Lymphocytes Relative: 4 %
Lymphs Abs: 0.6 10*3/uL — ABNORMAL LOW (ref 0.7–4.0)
MCH: 29.1 pg (ref 26.0–34.0)
MCHC: 31.8 g/dL (ref 30.0–36.0)
MCV: 91.5 fL (ref 80.0–100.0)
Monocytes Absolute: 0.2 10*3/uL (ref 0.1–1.0)
Monocytes Relative: 1 %
Neutro Abs: 12.5 10*3/uL — ABNORMAL HIGH (ref 1.7–7.7)
Neutrophils Relative %: 94 %
Platelets: 153 10*3/uL (ref 150–400)
RBC: 2.58 MIL/uL — ABNORMAL LOW (ref 4.22–5.81)
RDW: 21.3 % — ABNORMAL HIGH (ref 11.5–15.5)
WBC: 13.4 10*3/uL — ABNORMAL HIGH (ref 4.0–10.5)
nRBC: 0 % (ref 0.0–0.2)

## 2022-01-26 LAB — HEMOGLOBIN A1C
Hgb A1c MFr Bld: 7.1 % — ABNORMAL HIGH (ref 4.8–5.6)
Mean Plasma Glucose: 157.07 mg/dL

## 2022-01-26 LAB — HEMOGLOBIN AND HEMATOCRIT, BLOOD
HCT: 22.4 % — ABNORMAL LOW (ref 39.0–52.0)
Hemoglobin: 7 g/dL — ABNORMAL LOW (ref 13.0–17.0)

## 2022-01-26 LAB — GLUCOSE, CAPILLARY
Glucose-Capillary: 164 mg/dL — ABNORMAL HIGH (ref 70–99)
Glucose-Capillary: 169 mg/dL — ABNORMAL HIGH (ref 70–99)
Glucose-Capillary: 131 mg/dL — ABNORMAL HIGH (ref 70–99)

## 2022-01-26 LAB — MAGNESIUM
Magnesium: 1.8 mg/dL (ref 1.7–2.4)
Magnesium: 1.8 mg/dL (ref 1.7–2.4)

## 2022-01-26 LAB — MRSA NEXT GEN BY PCR, NASAL: MRSA by PCR Next Gen: NOT DETECTED

## 2022-01-26 LAB — PREPARE RBC (CROSSMATCH)

## 2022-01-26 MED ORDER — ORAL CARE MOUTH RINSE
15.0000 mL | OROMUCOSAL | Status: DC
  Administered 2022-01-26 – 2022-01-27 (×3): 15 mL via OROMUCOSAL

## 2022-01-26 MED ORDER — APIXABAN 5 MG PO TABS
10.0000 mg | ORAL_TABLET | Freq: Two times a day (BID) | ORAL | Status: DC
  Administered 2022-01-26 – 2022-01-27 (×3): 10 mg via ORAL
  Filled 2022-01-26 (×3): qty 2

## 2022-01-26 MED ORDER — MAGNESIUM SULFATE 2 GM/50ML IV SOLN
2.0000 g | Freq: Once | INTRAVENOUS | Status: AC
  Administered 2022-01-26: 2 g via INTRAVENOUS
  Filled 2022-01-26: qty 50

## 2022-01-26 MED ORDER — ACETAMINOPHEN 325 MG PO TABS: 650.0000 mg | ORAL_TABLET | Freq: Four times a day (QID) | ORAL | Status: DC | PRN

## 2022-01-26 MED ORDER — ORAL CARE MOUTH RINSE: 15.0000 mL | OROMUCOSAL | Status: DC | PRN

## 2022-01-26 MED ORDER — APIXABAN 5 MG PO TABS: 5.0000 mg | ORAL_TABLET | Freq: Two times a day (BID) | ORAL | Status: DC

## 2022-01-26 MED ORDER — MAGNESIUM SULFATE 2 GM/50ML IV SOLN
INTRAVENOUS | Status: AC
  Filled 2022-01-26: qty 50

## 2022-01-26 MED ORDER — ACETAMINOPHEN 325 MG PO TABS
650.0000 mg | ORAL_TABLET | Freq: Once | ORAL | Status: AC
  Administered 2022-01-26: 650 mg via ORAL
  Filled 2022-01-26: qty 2

## 2022-01-26 MED ORDER — INSULIN ASPART 100 UNIT/ML IJ SOLN
0.0000 [IU] | Freq: Three times a day (TID) | INTRAMUSCULAR | Status: DC
  Administered 2022-01-26: 2 [IU] via SUBCUTANEOUS
  Administered 2022-01-27: 3 [IU] via SUBCUTANEOUS

## 2022-01-26 MED ORDER — POTASSIUM CHLORIDE CRYS ER 20 MEQ PO TBCR
20.0000 meq | EXTENDED_RELEASE_TABLET | Freq: Once | ORAL | Status: AC
  Administered 2022-01-26: 20 meq via ORAL
  Filled 2022-01-26: qty 1

## 2022-01-26 MED ORDER — FUROSEMIDE 10 MG/ML IJ SOLN
20.0000 mg | Freq: Two times a day (BID) | INTRAMUSCULAR | Status: DC
  Administered 2022-01-26 – 2022-01-27 (×2): 20 mg via INTRAVENOUS
  Filled 2022-01-26 (×2): qty 2

## 2022-01-26 MED ORDER — ACETAMINOPHEN 650 MG RE SUPP: 650.0000 mg | Freq: Four times a day (QID) | RECTAL | Status: DC | PRN

## 2022-01-26 MED ORDER — FUROSEMIDE 10 MG/ML IJ SOLN
20.0000 mg | Freq: Once | INTRAMUSCULAR | Status: AC
  Administered 2022-01-26: 20 mg via INTRAVENOUS
  Filled 2022-01-26: qty 4

## 2022-01-26 MED ORDER — SODIUM CHLORIDE 0.9% IV SOLUTION: Freq: Once | INTRAVENOUS | Status: AC

## 2022-01-26 MED ORDER — CHLORHEXIDINE GLUCONATE CLOTH 2 % EX PADS
6.0000 | MEDICATED_PAD | Freq: Every day | CUTANEOUS | Status: DC
  Administered 2022-01-26 – 2022-01-27 (×2): 6 via TOPICAL

## 2022-01-26 MED ORDER — POTASSIUM CHLORIDE 10 MEQ/50ML IV SOLN
10.0000 meq | INTRAVENOUS | Status: DC
  Administered 2022-01-26 (×5): 10 meq via INTRAVENOUS
  Filled 2022-01-26 (×6): qty 50

## 2022-01-26 NOTE — ED Notes (Signed)
ED TO INPATIENT HANDOFF REPORT  Name/Age/Gender Terrence Rivera 81 y.o. male  Code Status    Code Status Orders  (From admission, onward)           Start     Ordered   01/26/22 0116  Full code  Continuous        01/26/22 0116           Code Status History     Date Active Date Inactive Code Status Order ID Comments User Context   12/01/2021 0250 12/04/2021 1747 Full Code [id number]  Rhetta Mura, DO ED   09/23/2021 1647 09/24/2021 1424 Full Code [id number]  Lucas Mallow, MD Inpatient       Home/SNF/Other Home  Chief Complaint Acute respiratory failure with hypoxia ([place]) [J96.01]  Level of Care/Admitting Diagnosis ED Disposition     ED Disposition  Admit   Condition  --   [facility]: [facility] [[id number]]  Level of Care: Stepdown [14]  Admit to SDU based on following criteria: Severe physiological/psychological symptoms:  Any diagnosis requiring assessment & intervention at least every 4 hours on an ongoing basis to obtain desired patient outcomes including stability and rehabilitation  May admit patient to [facility] or [facility] if equivalent level of care is available:: No  Covid Evaluation: Asymptomatic - no recent exposure (last 10 days) testing not required  Diagnosis: Acute respiratory failure with hypoxia [facility]) [[id number]]  Admitting Physician: Rhetta Mura [[id number]]  Attending Physician: Rhetta Mura [[id number]]  Certification:: I certify this patient will need inpatient services for at least 2 midnights  Estimated Length of Stay: 2          Medical History Past Medical History:  Diagnosis Date   Cancer ([place])    Hypertension     Allergies No Known Allergies  IV Location/Drains/Wounds Patient Lines/Drains/Airways Status     Active Line/Drains/Airways     Name Placement date Placement time Site Days   Implanted Port 11/23/21 Right Chest 11/23/21  1148  Chest  64             Labs/Imaging Results for orders placed or performed during the hospital encounter of 01/25/22 (from the past 48 hour(s))  Brain natriuretic peptide     Status: Abnormal   Collection Time: 01/25/22  5:18 PM  Result Value Ref Range   B Natriuretic Peptide 617.9 (H) 0.0 - 100.0 pg/mL    Comment: Performed at [facility], [place] [address], [place], [place] [address]  CBC with Differential     Status: Abnormal   Collection Time: 01/25/22  5:19 PM  Result Value Ref Range   WBC 12.5 (H) 4.0 - 10.5 K/uL   RBC 2.23 (L) 4.22 - 5.81 MIL/uL   Hemoglobin 6.3 (LL) 13.0 - 17.0 g/dL    Comment: REPEATED TO VERIFY THIS CRITICAL RESULT HAS VERIFIED AND BEEN CALLED TO M. MCIVER, RN BY JENNIFER COLE ON 11 20 2023 AT [place], AND HAS BEEN READ BACK.     HCT 21.1 (L) 39.0 - 52.0 %   MCV 94.6 80.0 - 100.0 fL   MCH 28.3 26.0 - 34.0 pg   MCHC 29.9 (L) 30.0 - 36.0 g/dL   RDW 21.6 (H) 11.5 - 15.5 %   Platelets 184 150 - 400 K/uL   nRBC 0.0 0.0 - 0.2 %   Neutrophils Relative % 88 %   Neutro Abs 11.1 (H) 1.7 - 7.7 K/uL  Lymphocytes Relative 6 %   Lymphs Abs 0.8 0.7 - 4.0 K/uL   Monocytes Relative 2 %   Monocytes Absolute 0.3 0.1 - 1.0 K/uL   Eosinophils Relative 1 %   Eosinophils Absolute 0.1 0.0 - 0.5 K/uL   Basophils Relative 0 %   Basophils Absolute 0.0 0.0 - 0.1 K/uL   WBC Morphology MORPHOLOGY UNREMARKABLE    RBC Morphology MORPHOLOGY UNREMARKABLE    Smear Review MORPHOLOGY UNREMARKABLE    Immature Granulocytes 3 %   Abs Immature Granulocytes 0.32 (H) 0.00 - 0.07 K/uL    Comment: Performed at [facility], [place] [address] [place], [place] [address]  Comprehensive metabolic panel     Status: Abnormal   Collection Time: 01/25/22  5:19 PM  Result Value Ref Range   Sodium 137 135 - 145 mmol/L   Potassium 3.1 (L) 3.5 - 5.1 mmol/L   Chloride 105 98 - 111 mmol/L   CO2 23 22 - 32 mmol/L   Glucose, Bld 242 (H) 70 - 99 mg/dL    Comment: Glucose reference range  applies only to samples taken after fasting for at least 8 hours.   BUN 28 (H) 8 - 23 mg/dL   Creatinine, Ser 1.41 (H) 0.61 - 1.24 mg/dL   Calcium 8.1 (L) 8.9 - 10.3 mg/dL   Total Protein 6.5 6.5 - 8.1 g/dL   Albumin 2.5 (L) 3.5 - 5.0 g/dL   AST 26 15 - 41 U/L   ALT 23 0 - 44 U/L   Alkaline Phosphatase 60 38 - 126 U/L   Total Bilirubin 0.5 0.3 - 1.2 mg/dL   GFR, Estimated 51 (L) >60 mL/min    Comment: (NOTE) Calculated using the CKD-EPI Creatinine Equation (2021)    Anion gap 9 5 - 15    Comment: Performed at [facility], [place] [address], [place], [place] [address]  Prepare RBC (crossmatch)     Status: None   Collection Time: 01/25/22  6:33 PM  Result Value Ref Range   Order Confirmation      ORDER PROCESSED BY BLOOD BANK Performed at [facility], [place] [address] [place], [place] [address]   Type and screen [place]     Status: None (Preliminary result)   Collection Time: 01/25/22  6:42 PM  Result Value Ref Range   ABO/RH(D) O NEG    Antibody Screen NEG    Sample Expiration 01/28/2022,2359    Unit Number [id number]    Blood Component Type RED CELLS,LR    Unit division 00    Status of Unit ISSUED    Transfusion Status OK TO TRANSFUSE    Crossmatch Result      Compatible Performed at [facility], [place] [address], [place], [place] [address]   ABO/Rh     Status: None   Collection Time: 01/25/22  7:22 PM  Result Value Ref Range   ABO/RH(D)      Jenetta Downer NEG Performed at [place] [address], [place], [place] [address]    VAS [place] LOWER EXTREMITY VENOUS (DVT) (ONLY [facility] & [facility])  Result Date: 01/25/2022  Lower Venous DVT Study Patient Name:  Terrence Rivera  Date of Exam:   01/25/2022 Medical Rec #: [id number]              Accession #:    [id number] Date of Birth: 1940-09-11  Patient Gender: M Patient Age:   76 years Exam Location:  [facility] Procedure:       VAS [place] LOWER EXTREMITY VENOUS (DVT) Referring Phys: Cherlynn June --------------------------------------------------------------------------------  Indications: Swelling.  Risk Factors: None identified. Limitations: Poor ultrasound/tissue interface and patient positioning. Comparison Study: No prior studies. Performing Technologist: Oliver Hum RVT  Examination Guidelines: A complete evaluation includes B-mode imaging, spectral Doppler, color Doppler, and power Doppler as needed of all accessible portions of each vessel. Bilateral testing is considered an integral part of a complete examination. Limited examinations for reoccurring indications may be performed as noted. The reflux portion of the exam is performed with the patient in reverse Trendelenburg.  +---------+---------------+---------+-----------+----------+--------------+ RIGHT    CompressibilityPhasicitySpontaneityPropertiesThrombus Aging +---------+---------------+---------+-----------+----------+--------------+ CFV      Full           Yes      No                                  +---------+---------------+---------+-----------+----------+--------------+ SFJ      Full                                                        +---------+---------------+---------+-----------+----------+--------------+ FV Prox  Full                                                        +---------+---------------+---------+-----------+----------+--------------+ FV Mid   Full                                                        +---------+---------------+---------+-----------+----------+--------------+ FV DistalFull                                                        +---------+---------------+---------+-----------+----------+--------------+ PFV      Full                                                        +---------+---------------+---------+-----------+----------+--------------+ POP      Full           Yes       No                                  +---------+---------------+---------+-----------+----------+--------------+ PTV      Full                                                        +---------+---------------+---------+-----------+----------+--------------+  PERO     Full                                                        +---------+---------------+---------+-----------+----------+--------------+ Gastroc  None                                         Acute          +---------+---------------+---------+-----------+----------+--------------+   +---------+---------------+---------+-----------+----------+--------------+ LEFT     CompressibilityPhasicitySpontaneityPropertiesThrombus Aging +---------+---------------+---------+-----------+----------+--------------+ CFV      Full           Yes      No                                  +---------+---------------+---------+-----------+----------+--------------+ SFJ      Full                                                        +---------+---------------+---------+-----------+----------+--------------+ FV Prox  Full                                                        +---------+---------------+---------+-----------+----------+--------------+ FV Mid   Full                                                        +---------+---------------+---------+-----------+----------+--------------+ FV DistalFull                                                        +---------+---------------+---------+-----------+----------+--------------+ PFV      Full                                                        +---------+---------------+---------+-----------+----------+--------------+ POP      Full           Yes      No                                  +---------+---------------+---------+-----------+----------+--------------+ PTV      Full                                                         +---------+---------------+---------+-----------+----------+--------------+  PERO     Full                                                        +---------+---------------+---------+-----------+----------+--------------+ Gastroc  Partial                                      Acute          +---------+---------------+---------+-----------+----------+--------------+     Summary: RIGHT: - Findings consistent with acute deep vein thrombosis involving the right gastrocnemius veins. - No cystic structure found in the popliteal fossa.  LEFT: - Findings consistent with acute deep vein thrombosis involving the left gastrocnemius veins. - No cystic structure found in the popliteal fossa.  *See table(s) above for measurements and observations. Electronically signed by Servando Snare MD on 01/25/2022 at 5:32:22 PM.    Final    DG Chest 2 View  Result Date: 01/25/2022 CLINICAL DATA:  Lower extremity edema. EXAM: CHEST - 2 VIEW COMPARISON:  None Available. FINDINGS: Mild cardiomegaly is noted with mild central pulmonary vascular congestion. No consolidative process is noted. Right internal jugular Port-A-Cath is noted. Bony thorax is unremarkable. IMPRESSION: Mild cardiomegaly with mild central pulmonary vascular congestion. Electronically Signed   By: Marijo Conception M.D.   On: 01/25/2022 15:40    Pending Labs Unresulted Labs (From admission, onward)     Start     Ordered   01/26/22 0500  CBC with Differential/Platelet  Tomorrow morning,   R        01/26/22 0118   01/26/22 0500  Comprehensive metabolic panel  Tomorrow morning,   R        01/26/22 0118   01/26/22 0500  Magnesium  Tomorrow morning,   R        01/26/22 0118   01/26/22 0119  Magnesium  Add-on,   AD        01/26/22 0118            Vitals/Pain Today's Vitals   01/26/22 0115 01/26/22 0122 01/26/22 0125 01/26/22 0200  BP: 103/70   (!) 114/58  Pulse: 93   89  Resp: (!) 26   18  Temp:   99.6 F (37.6 C)   TempSrc:   Oral    SpO2: 99%   100%  PainSc:  Asleep      Isolation Precautions No active isolations  Medications Medications  acetaminophen (TYLENOL) tablet 650 mg (has no administration in time range)    Or  acetaminophen (TYLENOL) suppository 650 mg (has no administration in time range)  apixaban (ELIQUIS) tablet 10 mg (has no administration in time range)    Followed by  apixaban (ELIQUIS) tablet 5 mg (has no administration in time range)  0.9 %  sodium chloride infusion (10 mL/hr Intravenous New Bag/Given 01/25/22 1900)  apixaban (ELIQUIS) tablet 10 mg (10 mg Oral Given 01/25/22 1920)  furosemide (LASIX) injection 20 mg (20 mg Intravenous Given 01/25/22 1920)  acetaminophen (TYLENOL) tablet 650 mg (650 mg Oral Given 01/26/22 0029)  furosemide (LASIX) injection 20 mg (20 mg Intravenous Given 01/26/22 0154)    Mobility walks with person assist

## 2022-01-26 NOTE — Progress Notes (Addendum)
Pt tolerated transport well on bipap from ED to 4114 without complication.

## 2022-01-26 NOTE — Plan of Care (Signed)
  Problem: Clinical Measurements: Goal: Respiratory complications will improve Outcome: Progressing   Problem: Activity: Goal: Risk for activity intolerance will decrease Outcome: Progressing   Problem: Pain Managment: Goal: General experience of comfort will improve Outcome: Progressing   

## 2022-01-26 NOTE — Progress Notes (Signed)
Pt stable at this time. Report given. Pt's belongings collected.

## 2022-01-26 NOTE — Progress Notes (Signed)
  Carryover admission to the Day Admitter.  I discussed this case with the EDP, Dr.  Betsey Holiday.  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  Patient initially presented to the [facility] emergency department today for leg swelling for which time venous ultrasound revealed acute bilateral DVTs.  Labs revealed hemoglobin of 6.3 compared to prior value of 7.5 on 11/16.   EDP from earlier in the Day, Dr. Maryan Rued , spoke with oncology (Dr. Lindi Adie), who recommended initiation of Eliquis for the acute bilateral DVTs and recommended transfusion of 1 unit PRBC in the ED today followed by d/c to home with plan for additional PRBC transfusion at the transfusion center later this week.   He was pretreated with a dose of Lasix 20 mg IV.  However, during transfusion of 1 unit prbc, the patient O2 sats in the high 80s, hypertensive, with ensuing chest x-ray showing interval development of pulmonary edema. Started on BiPAP, with ensuing oxygen saturations in the high 90s to 100% with improvement in blood pressure and RR. No rash. He DID complete transfusion of the full unit prbc.  Developed a fever, but this was after completion of transfusion of the unit PRBC.  Of note , he did receive his first dose of Eliquis this evening, per oncology recommendation.    I have placed an order for  inpatient admit to sdu for the above, as he remains on bipap at this time.    I have placed some additional preliminary admit orders via the adult multi-morbid admission order set. I have also ordered an additional dose of Lasix 20 mg IV x1. I also placed pharmacist consult for Eliquis.   I also ordered morning labs,  including CMP, CBC, mag level.    Babs Bertin, DO Hospitalist

## 2022-01-26 NOTE — Progress Notes (Signed)
RT took pt off BIPAP and placed on RA. No distress noted at this time.

## 2022-01-26 NOTE — Progress Notes (Signed)
[facility] admitting physician addendum:  The hemoglobin level is down to 7.0 g/dL.  Given downtrending level along with his malignancy and cardiac history, I will transfer another unit of PRBC with furosemide premedication.  We will continue to follow hematocrit and hemoglobin.  Tennis Must, MD.

## 2022-01-26 NOTE — H&P (Signed)
History and Physical    Patient: Terrence Rivera KDT:[id number] DOB: 03/10/1940 DOA: 01/25/2022 DOS: the patient was seen and examined on 01/26/2022 PCP: [name], MD  Patient coming from: Home  Chief Complaint:  Chief Complaint  Patient presents with   Leg Swelling   HPI: Terrence Rivera is a 81 y.o. male with medical history significant of bladder cancer who finished chemotherapy last week, hypertension, pseudogout, protein calorie malnutrition who presented to the emergency department with bilateral lower extremity edema secondary to lateral DVT diagnosed yesterday evening.  Dr. Maryan Rued initially spoke to oncology who recommended a transfusion of PRBC and discharge home on apixaban.  However, during the transfusion the patient became febrile, tachycardic, tachypneic/dyspneic, hypertensive and was placed on BiPAP due to acute respiratory distress which seem to have been from blood transfusion reaction along with pulmonary edema.  He is now off BiPAP and in no acute distress.  He was able to eat breakfast. He denied rhinorrhea, sore throat, wheezing or hemoptysis.  No chest pain, palpitations, diaphoresis, PND, orthopnea but positive pitting edema of the lower extremities.  No abdominal pain, nausea, emesis, diarrhea, constipation, melena or hematochezia.  No flank pain, dysuria or hematuria.  No polyuria, polydipsia, polyphagia or blurred vision.  His wife stated that he has had periods of sleep apnea while sleeping and has been snoring loudly as well.  ED course: Initial vital signs were temperature 98.9 F, pulse 94, respirations 16, BP 136/58 mmHg and O2 sat 100% on room air.  The patient received furosemide 20 mg IVP at 1900 yesterday and asked 0 130 today.  He also was started on apixaban.  He also received acetaminophen 650 mg p.o.  Lab work: CBC with a white count 12.5, hemoglobin 6.3 g/dL and platelets 184.  Hemoglobin this morning after transfusion was 7.5 g/dL.   Magnesium 1.8 mg/dL.  BNP 618 pg/mL.  CMP with a potassium of 3.1 mmol/L, the rest of the electrolytes were normal after calcium correction.  Glucose 242, BUN 28 and creatinine 1.41 mg deciliter.  Albumin was 2.5 g/dL.  Imaging: Two-view chest radiograph with mild cardiomegaly and mild central pulmonary vascular congestion.   Review of Systems: As mentioned in the history of present illness. All other systems reviewed and are negative. Past Medical History:  Diagnosis Date   Cancer [facility])    Hypertension    Past Surgical History:  Procedure Laterality Date   CYSTOSCOPY Bilateral 09/23/2021   Procedure: CYSTOSCOPY TRANSURETHRAL RESECTION OF BLADDER TUMOR (TURBT);  Surgeon: Lucas Mallow, MD;  Location: [facility];  Service: Urology;  Laterality: Bilateral;   IR IMAGING GUIDED PORT INSERTION  11/23/2021   TRANSURETHRAL RESECTION OF PROSTATE N/A 09/23/2021   Procedure: TRANSURETHRAL RESECTION OF THE PROSTATE (TURP);  Surgeon: Lucas Mallow, MD;  Location: [facility];  Service: Urology;  Laterality: N/A;  1 HR FOR CASE   Social History:  reports that he has never smoked. He has never used smokeless tobacco. He reports current alcohol use. He reports that he does not use drugs.  No Known Allergies  No family history on file.  Prior to Admission medications   Medication Sig Start Date End Date Taking? Authorizing Provider  APIXABAN (ELIQUIS) VTE STARTER PACK ('10MG'$  AND '5MG'$ ) Take as directed on package: start with two-'5mg'$  tablets twice daily for 7 days. On day 8, switch to one-'5mg'$  tablet twice daily. 01/25/22  Yes Plunkett, Loree Fee, MD  lidocaine-prilocaine (EMLA) cream Apply 1 Application topically as needed.  10/30/21   Wyatt Portela, MD  lisinopril (ZESTRIL) 20 MG tablet Take 1 tablet (20 mg total) by mouth daily. 06/14/19   Cardama, Grayce Sessions, MD  oxyCODONE (OXY IR/ROXICODONE) 5 MG immediate release tablet Take 1 tablet (5 mg total) by mouth every 4 (four) hours as needed for moderate  pain. Patient not taking: Reported on 12/01/2021 09/24/21   Lucas Mallow, MD  prochlorperazine (COMPAZINE) 10 MG tablet Take 1 tablet (10 mg total) by mouth every 6 (six) hours as needed for nausea or vomiting. 10/30/21   Wyatt Portela, MD    Physical Exam: Vitals:   01/26/22 0405 01/26/22 0500 01/26/22 0600 01/26/22 0756  BP:  137/71 139/61   Pulse:  79 74   Resp:  17 14   Temp: 98.4 F (36.9 C)   98.2 F (36.8 C)  TempSrc: Axillary   Oral  SpO2:  100% 100%   Weight: 64.6 kg     Height: '5\' 6"'$  (1.676 m)      Physical Exam Vitals and nursing note reviewed.  Constitutional:      General: He is awake. He is not in acute distress.    Comments: Chronically ill-appearing.  HENT:     Head: Normocephalic.     Mouth/Throat:     Mouth: Mucous membranes are moist.  Eyes:     General: No scleral icterus.    Pupils: Pupils are equal, round, and reactive to light.  Neck:     Vascular: No JVD.  Cardiovascular:     Rate and Rhythm: Normal rate and regular rhythm.     Heart sounds: S1 normal and S2 normal. Murmur heard.     Systolic murmur is present with a grade of 3/6.  Pulmonary:     Effort: Pulmonary effort is normal. No tachypnea or accessory muscle usage.     Breath sounds: Examination of the right-lower field reveals rales. Examination of the left-lower field reveals rales. Rales present. No wheezing or rhonchi.  Abdominal:     General: Bowel sounds are normal. There is no distension.     Palpations: Abdomen is soft.     Tenderness: There is no abdominal tenderness. There is no guarding.  Musculoskeletal:     Cervical back: Neck supple.     Right lower leg: 2+ Edema present.     Left lower leg: 2+ Edema present.  Skin:    General: Skin is warm and dry.  Neurological:     General: No focal deficit present.     Mental Status: He is alert and oriented to person, place, and time.  Psychiatric:        Mood and Affect: Mood normal.        Behavior: Behavior is cooperative.    Data Reviewed:  There are no new results to review at this time.  12/01/2021 echocardiogram IMPRESSIONS    1. Left ventricular ejection fraction, by estimation, is 55 to 60%. The  left ventricle has normal function. The left ventricle has no regional  wall motion abnormalities. There is mild asymmetric left ventricular  hypertrophy of the basal-septal segment.  Left ventricular diastolic parameters are consistent with Grade II  diastolic dysfunction (pseudonormalization).   2. Right ventricular systolic function is normal. The right ventricular  size is normal. There is severely elevated pulmonary artery systolic  pressure. The estimated right ventricular systolic pressure is 79.0 mmHg.   3. Left atrial size was mildly dilated.   4. 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 There is  eccentric mitral regurgitation with horizontal color splay seen in the  apical views. 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 No evidence  of mitral stenosis. Pulmonary veins not  well assessed.   5. The tricuspid valve is normal. Tricuspid valve regurgitation is  moderate to severe. Hepatic Doppler signal not performed.   6. The aortic valve is tricuspid. There is mild thickening of the aortic  valve. Aortic valve regurgitation is not visualized. No aortic stenosis is  present.   Assessment and Plan: Principal Problem:   Acute on chronic diastolic CHF (congestive heart failure) (HCC)  Associated with:   Symptomatic anemia With intra-transfusion development of:   Acute respiratory failure with hypoxia (HCC) Requiring BiPAP ventilation mode. Admit to stepdown/inpatient. Supplemental oxygen as needed. BiPAP as needed and at bedtime while in the hospital. Recommended him to get OSA evaluation. Fluid and sodium restriction. Continue furosemide 20 mg IVP twice daily. Monitor daily weights, intake and output. Hold lisinopril due to renal function. No beta-blocker due to acute  decompensation. Follow-up renal function electrolytes. Follow hematocrit and hemoglobin. Transfuse as needed. He is now stable enough to go to PCU.  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 Follow-up with hematology as an outpatient.    Hypokalemia Replacing. Magnesium has been supplemented. Follow potassium level.    Prolonged QT interval Optimize electrolytes. Keep K over 4.0 mmol/L. Keep magnesium over 2.0 mg/dL. Recheck EKG in AM.    Elevated serum creatinine Does not meet criteria for AKI. Continue holding ACE inhibitor. Monitor renal function and electrolytes closely.    Essential hypertension On furosemide IVP. Holding lisinopril due to current GFR. Monitor BP, renal function and electrolytes.    Type 2 diabetes mellitus with hyperglycemia,  without long-term current use of insulin (HCC) Check hemoglobin A1c. Carbohydrate modified diet. CBG monitoring before meals and bedtime.    Pressure injury of skin POA Continue local care.    Protein-calorie malnutrition, severe ([place]) In the setting of malignancy. Protein supplementation. Consider nutritional services evaluation.    Mixed hyperlipidemia Not on therapy at the moment. Follow-up with primary care provider.   Advance Care Planning:   Code Status: Full Code   Consults:   Family Communication: His wife was at bedside.  Severity of Illness: The appropriate patient status for this patient is INPATIENT. Inpatient status is judged to be reasonable and necessary in order to provide the required intensity of service to ensure the patient's safety. The patient's presenting symptoms, physical exam findings, and initial radiographic and laboratory data in the context of their chronic comorbidities is felt to place them at high risk for further clinical deterioration. Furthermore, it is not anticipated that the patient will be medically stable  for discharge from the hospital within 2 midnights of admission.   * I certify that at the point of admission it is my clinical judgment that the patient will require inpatient hospital care spanning beyond 2 midnights from the point of admission due to high intensity of service, high risk for further deterioration and high frequency of surveillance required.*  Author: Reubin Milan, MD 01/26/2022 8:05 AM  For on call review [email or web address].   This document was prepared using Dragon voice recognition software and may contain some unintended transcription errors.

## 2022-01-26 NOTE — Progress Notes (Signed)
ANTICOAGULATION CONSULT NOTE - Initial Consult  Pharmacy Consult for Eliquis Indication: DVT  No Known Allergies  Patient Measurements:    Vital Signs: Temp: 99.6 F (37.6 C) (11/21 0125) Temp Source: Oral (11/21 0125) BP: 103/70 (11/21 0115) Pulse Rate: 93 (11/21 0115)  Labs: Recent Labs    01/25/22 1719  HGB 6.3*  HCT 21.1*  PLT 184  CREATININE 1.41*    Estimated Creatinine Clearance: 40.2 mL/min (A) (by C-G formula based on SCr of 1.41 mg/dL (H)).   Medical History: Past Medical History:  Diagnosis Date   Cancer ([place])    Hypertension     Medications:  Not on anticoagulation PTA  Assessment: New bilateral DVTs recommended to start Eliquis per hematology.  Note currently transfusing for Hg 6.3.  Plan:  Eliquis '10mg'$  PO BID x 7 days then '5mg'$  PO BID Monitor closely for s/sx of bleeding  Netta Cedars PharmD 01/26/2022,1:32 AM

## 2022-01-26 NOTE — ED Notes (Signed)
ED TO INPATIENT HANDOFF REPORT  Name/Age/Gender Terrence Rivera 81 y.o. male  Code Status    Code Status Orders  (From admission, onward)           Start     Ordered   01/26/22 0116  Full code  Continuous        01/26/22 0116           Code Status History     Date Active Date Inactive Code Status Order ID Comments User Context   12/01/2021 0250 12/04/2021 1747 Full Code [id number]  Rhetta Mura, DO ED   09/23/2021 1647 09/24/2021 1424 Full Code [id number]  Lucas Mallow, MD Inpatient       Home/SNF/Other Home  Chief Complaint Acute respiratory failure with hypoxia ([place]) [J96.01]  Level of Care/Admitting Diagnosis ED Disposition     ED Disposition  Admit   Condition  --   [place] [facility]: [facility] [[id number]]  Level of Care: Stepdown [14]  Admit to SDU based on following criteria: Severe physiological/psychological symptoms:  Any diagnosis requiring assessment & intervention at least every 4 hours on an ongoing basis to obtain desired patient outcomes including stability and rehabilitation  May admit patient to [facility] or [facility] if equivalent level of care is available:: No  Covid Evaluation: Asymptomatic - no recent exposure (last 10 days) testing not required  Diagnosis: Acute respiratory failure with hypoxia [facility]) [[id number]]  Admitting Physician: Rhetta Mura [[id number]]  Attending Physician: Rhetta Mura [[id number]]  Certification:: I certify this patient will need inpatient services for at least 2 midnights  Estimated Length of Stay: 2          Medical History Past Medical History:  Diagnosis Date   Cancer ([place])    Hypertension     Allergies No Known Allergies  IV Location/Drains/Wounds Patient Lines/Drains/Airways Status     Active Line/Drains/Airways     Name Placement date Placement time Site Days   Implanted Port 11/23/21 Right Chest 11/23/21  1148  Chest  64             Labs/Imaging Results for orders placed or performed during the hospital encounter of 01/25/22 (from the past 48 hour(s))  Brain natriuretic peptide     Status: Abnormal   Collection Time: 01/25/22  5:18 PM  Result Value Ref Range   B Natriuretic Peptide 617.9 (H) 0.0 - 100.0 pg/mL    Comment: Performed at [facility], [place] [address] [place], [place] [address]  CBC with Differential     Status: Abnormal   Collection Time: 01/25/22  5:19 PM  Result Value Ref Range   WBC 12.5 (H) 4.0 - 10.5 K/uL   RBC 2.23 (L) 4.22 - 5.81 MIL/uL   Hemoglobin 6.3 (LL) 13.0 - 17.0 g/dL    Comment: REPEATED TO VERIFY THIS CRITICAL RESULT HAS VERIFIED AND BEEN CALLED TO M. MCIVER, RN BY JENNIFER COLE ON 11 20 2023 AT [facility], AND HAS BEEN READ BACK.     HCT 21.1 (L) 39.0 - 52.0 %   MCV 94.6 80.0 - 100.0 fL   MCH 28.3 26.0 - 34.0 pg   MCHC 29.9 (L) 30.0 - 36.0 g/dL   RDW 21.6 (H) 11.5 - 15.5 %   Platelets 184 150 - 400 K/uL   nRBC 0.0 0.0 - 0.2 %   Neutrophils Relative % 88 %   Neutro Abs 11.1 (H) 1.7 - 7.7 K/uL  Lymphocytes Relative 6 %   Lymphs Abs 0.8 0.7 - 4.0 K/uL   Monocytes Relative 2 %   Monocytes Absolute 0.3 0.1 - 1.0 K/uL   Eosinophils Relative 1 %   Eosinophils Absolute 0.1 0.0 - 0.5 K/uL   Basophils Relative 0 %   Basophils Absolute 0.0 0.0 - 0.1 K/uL   WBC Morphology MORPHOLOGY UNREMARKABLE    RBC Morphology MORPHOLOGY UNREMARKABLE    Smear Review MORPHOLOGY UNREMARKABLE    Immature Granulocytes 3 %   Abs Immature Granulocytes 0.32 (H) 0.00 - 0.07 K/uL    Comment: Performed at [facility], [place] [address] [place], [place] [address]  Comprehensive metabolic panel     Status: Abnormal   Collection Time: 01/25/22  5:19 PM  Result Value Ref Range   Sodium 137 135 - 145 mmol/L   Potassium 3.1 (L) 3.5 - 5.1 mmol/L   Chloride 105 98 - 111 mmol/L   CO2 23 22 - 32 mmol/L   Glucose, Bld 242 (H) 70 - 99 mg/dL    Comment: Glucose reference range  applies only to samples taken after fasting for at least 8 hours.   BUN 28 (H) 8 - 23 mg/dL   Creatinine, Ser 1.41 (H) 0.61 - 1.24 mg/dL   Calcium 8.1 (L) 8.9 - 10.3 mg/dL   Total Protein 6.5 6.5 - 8.1 g/dL   Albumin 2.5 (L) 3.5 - 5.0 g/dL   AST 26 15 - 41 U/L   ALT 23 0 - 44 U/L   Alkaline Phosphatase 60 38 - 126 U/L   Total Bilirubin 0.5 0.3 - 1.2 mg/dL   GFR, Estimated 51 (L) >60 mL/min    Comment: (NOTE) Calculated using the CKD-EPI Creatinine Equation (2021)    Anion gap 9 5 - 15    Comment: Performed at [facility], [place] [address], [place], [place] [address]  Prepare RBC (crossmatch)     Status: None   Collection Time: 01/25/22  6:33 PM  Result Value Ref Range   Order Confirmation      ORDER PROCESSED BY BLOOD BANK Performed at [facility], [place] [address], [place], [place] [address]   Type and screen [place]     Status: None (Preliminary result)   Collection Time: 01/25/22  6:42 PM  Result Value Ref Range   ABO/RH(D) O NEG    Antibody Screen NEG    Sample Expiration 01/28/2022,2359    Unit Number [id number]    Blood Component Type RED CELLS,LR    Unit division 00    Status of Unit ISSUED    Transfusion Status OK TO TRANSFUSE    Crossmatch Result      Compatible Performed at [facility], [place] [address] [place], [place] [address]   ABO/Rh     Status: None   Collection Time: 01/25/22  7:22 PM  Result Value Ref Range   ABO/RH(D)      Jenetta Downer NEG Performed at [facility] [address] [place], [place] [address]    VAS [place] LOWER EXTREMITY VENOUS (DVT) (ONLY [facility] & [facility])  Result Date: 01/25/2022  Lower Venous DVT Study Patient Name:  CAYLON SAINE  Date of Exam:   01/25/2022 Medical Rec #: [id number]              Accession #:    [id number] Date of Birth: May 15, 1940  Patient Gender: M Patient Age:   30 years Exam Location:  [facility] Procedure:       VAS [place] LOWER EXTREMITY VENOUS (DVT) Referring Phys: Cherlynn June --------------------------------------------------------------------------------  Indications: Swelling.  Risk Factors: None identified. Limitations: Poor ultrasound/tissue interface and patient positioning. Comparison Study: No prior studies. Performing Technologist: Oliver Hum RVT  Examination Guidelines: A complete evaluation includes B-mode imaging, spectral Doppler, color Doppler, and power Doppler as needed of all accessible portions of each vessel. Bilateral testing is considered an integral part of a complete examination. Limited examinations for reoccurring indications may be performed as noted. The reflux portion of the exam is performed with the patient in reverse Trendelenburg.  +---------+---------------+---------+-----------+----------+--------------+ RIGHT    CompressibilityPhasicitySpontaneityPropertiesThrombus Aging +---------+---------------+---------+-----------+----------+--------------+ CFV      Full           Yes      No                                  +---------+---------------+---------+-----------+----------+--------------+ SFJ      Full                                                        +---------+---------------+---------+-----------+----------+--------------+ FV Prox  Full                                                        +---------+---------------+---------+-----------+----------+--------------+ FV Mid   Full                                                        +---------+---------------+---------+-----------+----------+--------------+ FV DistalFull                                                        +---------+---------------+---------+-----------+----------+--------------+ PFV      Full                                                        +---------+---------------+---------+-----------+----------+--------------+ POP      Full           Yes       No                                  +---------+---------------+---------+-----------+----------+--------------+ PTV      Full                                                        +---------+---------------+---------+-----------+----------+--------------+  PERO     Full                                                        +---------+---------------+---------+-----------+----------+--------------+ Gastroc  None                                         Acute          +---------+---------------+---------+-----------+----------+--------------+   +---------+---------------+---------+-----------+----------+--------------+ LEFT     CompressibilityPhasicitySpontaneityPropertiesThrombus Aging +---------+---------------+---------+-----------+----------+--------------+ CFV      Full           Yes      No                                  +---------+---------------+---------+-----------+----------+--------------+ SFJ      Full                                                        +---------+---------------+---------+-----------+----------+--------------+ FV Prox  Full                                                        +---------+---------------+---------+-----------+----------+--------------+ FV Mid   Full                                                        +---------+---------------+---------+-----------+----------+--------------+ FV DistalFull                                                        +---------+---------------+---------+-----------+----------+--------------+ PFV      Full                                                        +---------+---------------+---------+-----------+----------+--------------+ POP      Full           Yes      No                                  +---------+---------------+---------+-----------+----------+--------------+ PTV      Full                                                         +---------+---------------+---------+-----------+----------+--------------+  PERO     Full                                                        +---------+---------------+---------+-----------+----------+--------------+ Gastroc  Partial                                      Acute          +---------+---------------+---------+-----------+----------+--------------+     Summary: RIGHT: - Findings consistent with acute deep vein thrombosis involving the right gastrocnemius veins. - No cystic structure found in the popliteal fossa.  LEFT: - Findings consistent with acute deep vein thrombosis involving the left gastrocnemius veins. - No cystic structure found in the popliteal fossa.  *See table(s) above for measurements and observations. Electronically signed by Servando Snare MD on 01/25/2022 at 5:32:22 PM.    Final    DG Chest 2 View  Result Date: 01/25/2022 CLINICAL DATA:  Lower extremity edema. EXAM: CHEST - 2 VIEW COMPARISON:  None Available. FINDINGS: Mild cardiomegaly is noted with mild central pulmonary vascular congestion. No consolidative process is noted. Right internal jugular Port-A-Cath is noted. Bony thorax is unremarkable. IMPRESSION: Mild cardiomegaly with mild central pulmonary vascular congestion. Electronically Signed   By: Marijo Conception M.D.   On: 01/25/2022 15:40    Pending Labs Unresulted Labs (From admission, onward)     Start     Ordered   01/26/22 0500  CBC with Differential/Platelet  Tomorrow morning,   R        01/26/22 0118   01/26/22 0500  Comprehensive metabolic panel  Tomorrow morning,   R        01/26/22 0118   01/26/22 0500  Magnesium  Tomorrow morning,   R        01/26/22 0118   01/26/22 0119  Magnesium  Add-on,   AD        01/26/22 0118            Vitals/Pain Today's Vitals   01/26/22 0115 01/26/22 0122 01/26/22 0125 01/26/22 0200  BP: 103/70   (!) 114/58  Pulse: 93   89  Resp: (!) 26   18  Temp:   99.6 F (37.6 C)   TempSrc:   Oral    SpO2: 99%   100%  PainSc:  Asleep      Isolation Precautions No active isolations  Medications Medications  acetaminophen (TYLENOL) tablet 650 mg (has no administration in time range)    Or  acetaminophen (TYLENOL) suppository 650 mg (has no administration in time range)  apixaban (ELIQUIS) tablet 10 mg (has no administration in time range)    Followed by  apixaban (ELIQUIS) tablet 5 mg (has no administration in time range)  0.9 %  sodium chloride infusion (10 mL/hr Intravenous New Bag/Given 01/25/22 1900)  apixaban (ELIQUIS) tablet 10 mg (10 mg Oral Given 01/25/22 1920)  furosemide (LASIX) injection 20 mg (20 mg Intravenous Given 01/25/22 1920)  acetaminophen (TYLENOL) tablet 650 mg (650 mg Oral Given 01/26/22 0029)  furosemide (LASIX) injection 20 mg (20 mg Intravenous Given 01/26/22 0154)    Mobility walks with person assist

## 2022-01-27 ENCOUNTER — Other Ambulatory Visit: Payer: Self-pay

## 2022-01-27 ENCOUNTER — Other Ambulatory Visit (HOSPITAL_COMMUNITY): Payer: Self-pay

## 2022-01-27 ENCOUNTER — Encounter: Payer: Self-pay | Admitting: Oncology

## 2022-01-27 DIAGNOSIS — D649 Anemia, unspecified: Secondary | ICD-10-CM

## 2022-01-27 DIAGNOSIS — I5033 Acute on chronic diastolic (congestive) heart failure: Secondary | ICD-10-CM

## 2022-01-27 DIAGNOSIS — I82463 Acute embolism and thrombosis of calf muscular vein, bilateral: Secondary | ICD-10-CM

## 2022-01-27 DIAGNOSIS — D6481 Anemia due to antineoplastic chemotherapy: Secondary | ICD-10-CM

## 2022-01-27 DIAGNOSIS — J9601 Acute respiratory failure with hypoxia: Secondary | ICD-10-CM | POA: Diagnosis not present

## 2022-01-27 DIAGNOSIS — I10 Essential (primary) hypertension: Secondary | ICD-10-CM | POA: Diagnosis not present

## 2022-01-27 DIAGNOSIS — E43 Unspecified severe protein-calorie malnutrition: Secondary | ICD-10-CM

## 2022-01-27 DIAGNOSIS — E1165 Type 2 diabetes mellitus with hyperglycemia: Secondary | ICD-10-CM

## 2022-01-27 DIAGNOSIS — R9431 Abnormal electrocardiogram [ECG] [EKG]: Secondary | ICD-10-CM

## 2022-01-27 DIAGNOSIS — T451X5A Adverse effect of antineoplastic and immunosuppressive drugs, initial encounter: Secondary | ICD-10-CM

## 2022-01-27 LAB — BASIC METABOLIC PANEL
Anion gap: 8 (ref 5–15)
BUN: 23 mg/dL (ref 8–23)
CO2: 26 mmol/L (ref 22–32)
Calcium: 8 mg/dL — ABNORMAL LOW (ref 8.9–10.3)
Chloride: 105 mmol/L (ref 98–111)
Creatinine, Ser: 1.39 mg/dL — ABNORMAL HIGH (ref 0.61–1.24)
GFR, Estimated: 52 mL/min — ABNORMAL LOW (ref >60)
Glucose, Bld: 121 mg/dL — ABNORMAL HIGH (ref 70–99)
Potassium: 3.5 mmol/L (ref 3.5–5.1)
Sodium: 139 mmol/L (ref 135–145)

## 2022-01-27 LAB — TYPE AND SCREEN
ABO/RH(D): O NEG
Antibody Screen: NEGATIVE
Unit division: 0
Unit division: 0

## 2022-01-27 LAB — GLUCOSE, CAPILLARY
Glucose-Capillary: 115 mg/dL — ABNORMAL HIGH (ref 70–99)
Glucose-Capillary: 213 mg/dL — ABNORMAL HIGH (ref 70–99)

## 2022-01-27 LAB — BPAM RBC
Blood Product Expiration Date: 202312062359
Blood Product Expiration Date: 202312132359
ISSUE DATE / TIME: 202311202119
ISSUE DATE / TIME: 202311211516
Unit Type and Rh: 9500
Unit Type and Rh: 9500

## 2022-01-27 LAB — CBC
HCT: 26.6 % — ABNORMAL LOW (ref 39.0–52.0)
Hemoglobin: 8.5 g/dL — ABNORMAL LOW (ref 13.0–17.0)
MCH: 28.7 pg (ref 26.0–34.0)
MCHC: 32 g/dL (ref 30.0–36.0)
MCV: 89.9 fL (ref 80.0–100.0)
Platelets: 137 10*3/uL — ABNORMAL LOW (ref 150–400)
RBC: 2.96 MIL/uL — ABNORMAL LOW (ref 4.22–5.81)
RDW: 20.8 % — ABNORMAL HIGH (ref 11.5–15.5)
WBC: 4.4 10*3/uL (ref 4.0–10.5)
nRBC: 0.7 % — ABNORMAL HIGH (ref 0.0–0.2)

## 2022-01-27 LAB — MAGNESIUM: Magnesium: 2.1 mg/dL (ref 1.7–2.4)

## 2022-01-27 MED ORDER — POTASSIUM CHLORIDE CRYS ER 10 MEQ PO TBCR
40.0000 meq | EXTENDED_RELEASE_TABLET | Freq: Once | ORAL | Status: AC
  Administered 2022-01-27: 40 meq via ORAL
  Filled 2022-01-27: qty 4

## 2022-01-27 MED ORDER — ORAL CARE MOUTH RINSE: 15.0000 mL | OROMUCOSAL | Status: DC | PRN

## 2022-01-27 MED ORDER — APIXABAN (ELIQUIS) VTE STARTER PACK (10MG AND 5MG)
ORAL_TABLET | ORAL | 0 refills | Status: DC
  Filled 2022-01-27: qty 74, 30d supply, fill #0

## 2022-01-27 MED ORDER — LISINOPRIL 20 MG PO TABS: 20.0000 mg | ORAL_TABLET | Freq: Every day | ORAL | 0 refills | Status: DC

## 2022-01-27 MED ORDER — ACETAMINOPHEN 325 MG PO TABS: 650.0000 mg | ORAL_TABLET | Freq: Four times a day (QID) | ORAL | Status: DC | PRN

## 2022-01-27 MED ORDER — HEPARIN SOD (PORK) LOCK FLUSH 100 UNIT/ML IV SOLN
500.0000 [IU] | INTRAVENOUS | Status: AC | PRN
  Administered 2022-01-27: 500 [IU]
  Filled 2022-01-27: qty 5

## 2022-01-27 NOTE — Progress Notes (Signed)
PT refused BiPAP. Complained it was hurting the bridge of his nose.

## 2022-01-27 NOTE — Progress Notes (Signed)
Grandson acted as Astronomer per patien't s request

## 2022-01-27 NOTE — Discharge Summary (Signed)
Physician Discharge Summary  Terrence Rivera:[id number] DOB: 10-Oct-1940 DOA: 01/25/2022  PCP: [name], MD  Admit date: 01/25/2022 Discharge date: 01/27/2022  Time spent: 55 minutes  Recommendations for Outpatient Follow-up:  Follow-up with Dr. Alen Blew, hematology/oncology in 2 weeks. Follow-up with [name], MD in 1 to 2 weeks.  On follow-up patient's lower extremity DVT will need to be followed up upon.  Patient will need a basic metabolic profile done to follow-up on electrolytes and renal function.  Patient's blood pressure will need to be reassessed on follow-up.   Discharge Diagnoses:  Principal Problem:   Acute respiratory failure with hypoxia (HCC) Active Problems:   Hypokalemia   Pressure injury of skin   Protein-calorie malnutrition, severe (HCC)   Prolonged QT interval   Symptomatic anemia   Mixed hyperlipidemia   Essential hypertension   Type 2 diabetes mellitus with hyperglycemia, without long-term current use of insulin (HCC)   Acute on chronic diastolic CHF (congestive heart failure) (HCC)   Deep vein thrombosis of bilateral lower extremities (HCC)   Elevated serum creatinine   Anemia due to antineoplastic chemotherapy   Discharge Condition: Stable and improved.  Diet recommendation: Heart healthy  Filed Weights   01/26/22 0405  Weight: 64.6 kg    History of present illness:  HPI per Dr. Burnett Kanaris Terrence Rivera is a 81 y.o. male with medical history significant of bladder cancer who finished chemotherapy last week, hypertension, pseudogout, protein calorie malnutrition who presented to the emergency department with bilateral lower extremity edema secondary to lateral DVT diagnosed yesterday evening.  Dr. Maryan Rued initially spoke to oncology who recommended a transfusion of PRBC and discharge home on apixaban.  However, during the transfusion the patient became febrile, tachycardic, tachypneic/dyspneic, hypertensive and was  placed on BiPAP due to acute respiratory distress which seem to have been from blood transfusion reaction along with pulmonary edema.  He is now off BiPAP and in no acute distress.  He was able to eat breakfast. He denied rhinorrhea, sore throat, wheezing or hemoptysis.  No chest pain, palpitations, diaphoresis, PND, orthopnea but positive pitting edema of the lower extremities.  No abdominal pain, nausea, emesis, diarrhea, constipation, melena or hematochezia.  No flank pain, dysuria or hematuria.  No polyuria, polydipsia, polyphagia or blurred vision.  His wife stated that he has had periods of sleep apnea while sleeping and has been snoring loudly as well.   ED course: Initial vital signs were temperature 98.9 F, pulse 94, respirations 16, BP 136/58 mmHg and O2 sat 100% on room air.  The patient received furosemide 20 mg IVP at 1900 yesterday and asked 0 130 today.  He also was started on apixaban.  He also received acetaminophen 650 mg p.o.   Lab work: CBC with a white count 12.5, hemoglobin 6.3 g/dL and platelets 184.  Hemoglobin this morning after transfusion was 7.5 g/dL.  Magnesium 1.8 mg/dL.  BNP 618 pg/mL.  CMP with a potassium of 3.1 mmol/L, the rest of the electrolytes were normal after calcium correction.  Glucose 242, BUN 28 and creatinine 1.41 mg deciliter.  Albumin was 2.5 g/dL.   Imaging: Two-view chest radiograph with mild cardiomegaly and mild central pulmonary vascular congestion.  Hospital Course:  #1 acute on chronic diastolic CHF/symptomatic anemia with intra transfusion development of/acute respiratory failure with hypoxia -Patient initially was admitted with acute respiratory failure with hypoxia felt secondary to intra transfusional development from symptomatic anemia of acute on chronic CHF. -Patient initially  was admitted to the stepdown unit and initially required BiPAP however improved with supplemental oxygenation as well as diuresis. -Patient subsequently transfused a  total of 2 units packed red blood cells due to symptomatic anemia with a hemoglobin on presentation of 6.3 on admission and noted to have stabilized at 8.5 by day of discharge. -Patient improved clinically, BiPAP subsequently discontinued, acute hypoxia improved and had resolved by day of discharge such that by day of discharge patient with sats of 97% on room air. -Patient be discharged in stable and improved condition with outpatient follow-up with PCP and primary hematologist/oncologist.  2.  Bilateral lower extremity DVT -Noted on admission with evaluation by lower extremity Dopplers which were done for lower extremity swelling. -Case was discussed by EDP with patient's primary hematologist/oncologist who recommended apixaban which patient was started on without any complications or bleeding. -Outpatient follow-up with Dr. Alen Blew, hematology/oncology.  3.  Hypokalemia -Repleted. -Outpatient follow-up.  4.  Prolonged QT interval -Electrolytes repleted and optimized with potassium being Greater than 4, magnesium greater than 2. -Repeat EKG with resolution of QT prolongation.  5.  Elevated serum creatinine -Creatinine slightly elevated however did not meet criteria for AKI. -ACE inhibitor held and will be resumed 1 week postdischarge. -Outpatient follow-up with PCP.  6.  Hypertension -ACE inhibitor held and will be resumed 1 week postdischarge. -BP remained stable.  7.  Diabetes mellitus type 2 without long-term insulin use -Hemoglobin A1c noted at 7.1. -Patient maintained on sliding scale insulin throughout the hospitalization. -Outpatient follow-up with PCP.  8.  Pressure injury of skin, POA - Local wound care done.  9.  Severe protein calorie malnutrition -In the setting of malignancy. -Patient placed on nutritional supplementation.  10.  Hyperlipidemia -Outpatient follow-up with PCP.    Procedures: 2 units PRBCs Lower extremity Dopplers  01/25/2022  Consultations: None  Discharge Exam: Vitals:   01/27/22 0602 01/27/22 1335  BP: 132/76 128/83  Pulse: 76 81  Resp: 18 (!) 22  Temp: 98.2 F (36.8 C) 98.2 F (36.8 C)  SpO2: 96% 97%    General: NAD Cardiovascular: RRR with 3/6 SEM (chronic per patient) Respiratory: CTAB.  No wheezes, no crackles, no rhonchi.  Fair air movement.  Speaking in full sentences.  Discharge Instructions   Discharge Instructions     Diet - low sodium heart healthy   Complete by: As directed    Increase activity slowly   Complete by: As directed       Allergies as of 01/27/2022   No Known Allergies      Medication List     STOP taking these medications    oxyCODONE 5 MG immediate release tablet Commonly known as: Oxy IR/ROXICODONE       TAKE these medications    acetaminophen 325 MG tablet Commonly known as: TYLENOL Take 2 tablets (650 mg total) by mouth every 6 (six) hours as needed for mild pain (or Fever >/= 101).   Apixaban Starter Pack ('10mg'$  and '5mg'$ ) Commonly known as: ELIQUIS STARTER PACK Take as directed on package: start with two-'5mg'$  tablets twice daily for 7 days. On day 8, switch to one-'5mg'$  tablet twice daily.   lidocaine-prilocaine cream Commonly known as: EMLA Apply 1 Application topically as needed.   lisinopril 20 MG tablet Commonly known as: ZESTRIL Take 1 tablet (20 mg total) by mouth daily. Start taking on: February 03, 2022 What changed: These instructions start on February 03, 2022. If you are unsure what to do until then, ask your  doctor or other care provider.   prochlorperazine 10 MG tablet Commonly known as: COMPAZINE Take 1 tablet (10 mg total) by mouth every 6 (six) hours as needed for nausea or vomiting.       No Known Allergies  Follow-up Information     [name], MD. Schedule an appointment as soon as possible for a visit in 1 week(s).   Specialty: Family Medicine Why: Follow-up in 1 to 2 weeks.  Call for follow  up about the blood clot and ongoing prescriptions for blood thinner in the future Contact information: [place] [address] [phone]         Wyatt Portela, MD. Schedule an appointment as soon as possible for a visit in 2 week(s).   Specialty: Oncology Contact information: [place] [address] [phone]                  The results of significant diagnostics from this hospitalization (including imaging, microbiology, ancillary and laboratory) are listed below for reference.    Significant Diagnostic Studies: VAS [place] LOWER EXTREMITY VENOUS (DVT) (ONLY [facility] & [facility])  Result Date: 01/25/2022  Lower Venous DVT Study Patient Name:  Terrence Rivera  Date of Exam:   01/25/2022 Medical Rec #: [id number]              Accession #:    [id number] Date of Birth: Apr 13, 1940               Patient Gender: M Patient Age:   28 years Exam Location:  [facility] Procedure:      VAS [place] LOWER EXTREMITY VENOUS (DVT) Referring Phys: Terrence Rivera --------------------------------------------------------------------------------  Indications: Swelling.  Risk Factors: None identified. Limitations: Poor ultrasound/tissue interface and patient positioning. Comparison Study: No prior studies. Performing Technologist: Oliver Hum RVT  Examination Guidelines: A complete evaluation includes B-mode imaging, spectral Doppler, color Doppler, and power Doppler as needed of all accessible portions of each vessel. Bilateral testing is considered an integral part of a complete examination. Limited examinations for reoccurring indications may be performed as noted. The reflux portion of the exam is performed with the patient in reverse Trendelenburg.  +---------+---------------+---------+-----------+----------+--------------+ RIGHT    CompressibilityPhasicitySpontaneityPropertiesThrombus Aging  +---------+---------------+---------+-----------+----------+--------------+ CFV      Full           Yes      No                                  +---------+---------------+---------+-----------+----------+--------------+ SFJ      Full                                                        +---------+---------------+---------+-----------+----------+--------------+ FV Prox  Full                                                        +---------+---------------+---------+-----------+----------+--------------+ FV Mid   Full                                                        +---------+---------------+---------+-----------+----------+--------------+  FV DistalFull                                                        +---------+---------------+---------+-----------+----------+--------------+ PFV      Full                                                        +---------+---------------+---------+-----------+----------+--------------+ POP      Full           Yes      No                                  +---------+---------------+---------+-----------+----------+--------------+ PTV      Full                                                        +---------+---------------+---------+-----------+----------+--------------+ PERO     Full                                                        +---------+---------------+---------+-----------+----------+--------------+ Gastroc  None                                         Acute          +---------+---------------+---------+-----------+----------+--------------+   +---------+---------------+---------+-----------+----------+--------------+ LEFT     CompressibilityPhasicitySpontaneityPropertiesThrombus Aging +---------+---------------+---------+-----------+----------+--------------+ CFV      Full           Yes      No                                   +---------+---------------+---------+-----------+----------+--------------+ SFJ      Full                                                        +---------+---------------+---------+-----------+----------+--------------+ FV Prox  Full                                                        +---------+---------------+---------+-----------+----------+--------------+ FV Mid   Full                                                        +---------+---------------+---------+-----------+----------+--------------+  FV DistalFull                                                        +---------+---------------+---------+-----------+----------+--------------+ PFV      Full                                                        +---------+---------------+---------+-----------+----------+--------------+ POP      Full           Yes      No                                  +---------+---------------+---------+-----------+----------+--------------+ PTV      Full                                                        +---------+---------------+---------+-----------+----------+--------------+ PERO     Full                                                        +---------+---------------+---------+-----------+----------+--------------+ Gastroc  Partial                                      Acute          +---------+---------------+---------+-----------+----------+--------------+     Summary: RIGHT: - Findings consistent with acute deep vein thrombosis involving the right gastrocnemius veins. - No cystic structure found in the popliteal fossa.  LEFT: - Findings consistent with acute deep vein thrombosis involving the left gastrocnemius veins. - No cystic structure found in the popliteal fossa.  *See table(s) above for measurements and observations. Electronically signed by Servando Snare MD on 01/25/2022 at 5:32:22 PM.    Final    DG Chest 2 View  Result Date:  01/25/2022 CLINICAL DATA:  Lower extremity edema. EXAM: CHEST - 2 VIEW COMPARISON:  None Available. FINDINGS: Mild cardiomegaly is noted with mild central pulmonary vascular congestion. No consolidative process is noted. Right internal jugular Port-A-Cath is noted. Bony thorax is unremarkable. IMPRESSION: Mild cardiomegaly with mild central pulmonary vascular congestion. Electronically Signed   By: Marijo Conception M.D.   On: 01/25/2022 15:40    Microbiology: Recent Results (from the past 240 hour(s))  MRSA Next Gen by PCR, Nasal     Status: None   Collection Time: 01/26/22  3:50 AM   Specimen: Nasal Mucosa; Nasal Swab  Result Value Ref Range Status   MRSA by PCR Next Gen NOT DETECTED NOT DETECTED Final    Comment: (NOTE) The GeneXpert MRSA Assay (FDA approved for NASAL specimens only), is one component of a comprehensive MRSA colonization surveillance program. It is not intended to diagnose MRSA infection nor to guide  or monitor treatment for MRSA infections. Test performance is not FDA approved in patients less than 31 years old. Performed at [facility], [place] [address] [place], [place] [address]      Labs: Basic Metabolic Panel: Recent Labs  Lab 01/21/22 1254 01/25/22 1719 01/26/22 0426 01/26/22 1207 01/27/22 0725 01/27/22 0832  NA 141 137 138 137  --  139  K 3.7 3.1* 2.9* 3.5  --  3.5  CL 106 105 104 103  --  105  CO2 '26 23 25 26  '$ --  26  GLUCOSE 195* 242* 176* 194*  --  121*  BUN 24* 28* 25* 26*  --  23  CREATININE 1.40* 1.41* 1.43* 1.31*  --  1.39*  CALCIUM 8.7* 8.1* 8.2* 8.0*  --  8.0*  MG  --   --  1.8  1.8  --  2.1  --    Liver Function Tests: Recent Labs  Lab 01/21/22 1254 01/25/22 1719 01/26/22 0426  AST 22 26 35  ALT '27 23 27  '$ ALKPHOS 73 60 57  BILITOT 0.4 0.5 0.8  PROT 7.1 6.5 6.5  ALBUMIN 3.3* 2.5* 2.4*   No results for input(s): "LIPASE", "AMYLASE" in the last 168 hours. No results for input(s): "AMMONIA" in the last 168  hours. CBC: Recent Labs  Lab 01/21/22 1254 01/25/22 1719 01/26/22 0426 01/26/22 1207 01/27/22 0832  WBC 8.6 12.5* 13.4*  --  4.4  NEUTROABS 5.8 11.1* 12.5*  --   --   HGB 7.5* 6.3* 7.5* 7.0* 8.5*  HCT 23.4* 21.1* 23.6* 22.4* 26.6*  MCV 90.3 94.6 91.5  --  89.9  PLT 356 184 153  --  137*   Cardiac Enzymes: No results for input(s): "CKTOTAL", "CKMB", "CKMBINDEX", "TROPONINI" in the last 168 hours. BNP: BNP (last 3 results) Recent Labs    01/25/22 1718  BNP 617.9*    ProBNP (last 3 results) No results for input(s): "PROBNP" in the last 8760 hours.  CBG: Recent Labs  Lab 01/26/22 1248 01/26/22 1632 01/26/22 2105 01/27/22 0725 01/27/22 1125  GLUCAP 164* 169* 131* 115* 213*       Signed:  Irine Seal MD.  [facility] 01/27/2022, 3:23 PM

## 2022-02-01 ENCOUNTER — Telehealth: Payer: Self-pay

## 2022-02-01 NOTE — Telephone Encounter (Signed)
Returned call to patient's grandson regarding ED follow-up. I informed the grandson that someone from scheduling will be in touch to get this scheduled. He verbalized understanding and had no further questions.

## 2022-02-03 ENCOUNTER — Inpatient Hospital Stay (HOSPITAL_BASED_OUTPATIENT_CLINIC_OR_DEPARTMENT_OTHER): Payer: Medicare (Managed Care) | Admitting: Oncology

## 2022-02-03 ENCOUNTER — Other Ambulatory Visit: Payer: Self-pay

## 2022-02-03 VITALS — BP 169/83 | HR 89 | Temp 97.8°F | Resp 16 | Ht 66.0 in | Wt 148.7 lb

## 2022-02-03 DIAGNOSIS — C679 Malignant neoplasm of bladder, unspecified: Secondary | ICD-10-CM

## 2022-02-03 DIAGNOSIS — Z79899 Other long term (current) drug therapy: Secondary | ICD-10-CM | POA: Diagnosis not present

## 2022-02-03 DIAGNOSIS — Z7901 Long term (current) use of anticoagulants: Secondary | ICD-10-CM | POA: Diagnosis not present

## 2022-02-03 DIAGNOSIS — Z5111 Encounter for antineoplastic chemotherapy: Secondary | ICD-10-CM | POA: Diagnosis present

## 2022-02-03 NOTE — Progress Notes (Signed)
Informed patient & his family that his PET scan has been approved by insurance & may be scheduled by [employer], number given.  Scan needs to be scheduled by December 10 so results will be back by his next MD appointment on December 14.  Patient & family verbalize understanding.

## 2022-02-03 NOTE — Progress Notes (Signed)
Hematology and Oncology Follow Up Visit  Terrence Rivera [id number] 01-31-1941 81 y.o. 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  He was found to have tumor involving the prostatic urethra.    Prior Therapy:  He underwent cystoscopy and TURBT on September 23, 2021 which showed a large tumor involving the prostatic urethra with large volume disease around the ureteral orifices.   Neoadjuvant chemotherapy utilizing gemcitabine and cisplatin started on November 13, 2021.  Therapy has been interrupted due to hospitalization.  He completed 3 cycles of therapy with the last cycle of cisplatin was held due to kidney function.    Current therapy: Under evaluation for additional treatment.  Interim History: [name] returns today for a repeat follow-up.  Since last visit, he had completed the third cycle of chemotherapy although cisplatin was withheld due to worsening kidney function.  He was also hospitalized for respiratory failure and possible pneumonia as well as a deep vein thrombosis involving bilateral lower extremities.  He was started on Eliquis upon discharge.  Clinically, he reports no major complaints at this time.  He denies any nausea, vomiting or abdominal pain.  He denies any cough or wheezing.  His lower extremity edema still noticeable.    Medications: Updated on review Current Outpatient Medications  Medication Sig Dispense Refill   acetaminophen (TYLENOL) 325 MG tablet Take 2 tablets (650 mg total) by mouth every 6 (six) hours as needed for mild pain (or Fever >/= 101).     APIXABAN (ELIQUIS) VTE STARTER PACK ('10MG'$  AND '5MG'$ ) Take as directed on package: start with two-'5mg'$  tablets twice daily for 7 days. On day 8, switch to one-'5mg'$  tablet twice daily. 74 each 0   lidocaine-prilocaine (EMLA) cream Apply 1 Application  topically as needed. 30 g 0   lisinopril (ZESTRIL) 20 MG tablet Take 1 tablet (20 mg total) by mouth daily. 60 tablet 0   prochlorperazine (COMPAZINE) 10 MG tablet Take 1 tablet (10 mg total) by mouth every 6 (six) hours as needed for nausea or vomiting. 30 tablet 0   No current facility-administered medications for this visit.   Facility-Administered Medications Ordered in Other Visits  Medication Dose Route Frequency Provider Last Rate Last Admin   0.9 %  sodium chloride infusion   Intravenous Once Wyatt Portela, MD         Allergies: No Known Allergies    Physical Exam:   Blood pressure (!) 169/83, pulse 89, temperature 97.8 F (36.6 C), temperature source Temporal, resp. rate 16, height '5\' 6"'$  (1.676 m), weight 148 lb 11.2 oz (67.4 kg), SpO2 100 %.   ECOG: 1    General appearance: Alert, awake without any distress. Head: Atraumatic without abnormalities Oropharynx: Without any thrush or ulcers. Eyes: No scleral icterus. Lymph nodes: No lymphadenopathy noted in the cervical, supraclavicular, or axillary nodes Heart:regular rate and rhythm, without any murmurs or gallops.   Bilateral edema noted. Lung: Clear to auscultation without any rhonchi, wheezes or dullness to percussion. Abdomin: Soft, nontender without any shifting dullness or ascites. Musculoskeletal: No clubbing or cyanosis. Neurological: No motor or sensory deficits. Skin: No rashes or lesions.       Lab Results: Lab Results  Component Value Date   WBC 4.4 01/27/2022   HGB 8.5 (L) 01/27/2022   HCT 26.6 (L) 01/27/2022   MCV 89.9 01/27/2022   PLT  137 (L) 01/27/2022     Chemistry      Component Value Date/Time   NA 139 01/27/2022 0832   K 3.5 01/27/2022 0832   CL 105 01/27/2022 0832   CO2 26 01/27/2022 0832   BUN 23 01/27/2022 0832   CREATININE 1.39 (H) 01/27/2022 0832   CREATININE 1.40 (H) 01/21/2022 1254      Component Value Date/Time   CALCIUM 8.0 (L) 01/27/2022 0832   ALKPHOS 57  01/26/2022 0426   AST 35 01/26/2022 0426   AST 22 01/21/2022 1254   ALT 27 01/26/2022 0426   ALT 27 01/21/2022 1254   BILITOT 0.8 01/26/2022 0426   BILITOT 0.4 01/21/2022 1254       Radiological Studies:   Impression and Plan:  81 year old with:     1.  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    Treatment options at this time were discussed and the the natural course of his disease was reviewed.  He completed neoadjuvant chemotherapy with cisplatin withheld due to worsening renal function.  Treatment options were discussed at this time including radical cystectomy versus radiation therapy versus additional systemic therapy if he has more systemic and relapsed disease.  To complete his staging I will arrange for a PET scan which should be scheduled around December 7.  He has also follow-up with urology to discuss surgical options if he is still a candidate.   2.  IV access: Port-A-Cath continues to be in use and will be flushed periodically.   3.  Bilateral deep vein thrombosis: He is currently on Eliquis which I recommended continuing for the time being.   4.  Renal function surveillance: His creatinine clearance around 50 cc/min after his discharge on November 22.    5.  Follow-up: He will return in December after PET scan and his evaluation by urology.   30  minutes were spent on this encounter.  Time was spent on updating disease status, treatment choices and outlining future plan of care review.       Zola Button, MD 11/29/20231:31 PM

## 2022-02-10 ENCOUNTER — Other Ambulatory Visit: Payer: Self-pay | Admitting: *Deleted

## 2022-02-10 DIAGNOSIS — C679 Malignant neoplasm of bladder, unspecified: Secondary | ICD-10-CM

## 2022-02-11 ENCOUNTER — Telehealth: Payer: Self-pay | Admitting: *Deleted

## 2022-02-11 ENCOUNTER — Inpatient Hospital Stay: Payer: Medicare (Managed Care) | Attending: Oncology

## 2022-02-11 DIAGNOSIS — J90 Pleural effusion, not elsewhere classified: Secondary | ICD-10-CM | POA: Insufficient documentation

## 2022-02-11 DIAGNOSIS — R599 Enlarged lymph nodes, unspecified: Secondary | ICD-10-CM | POA: Insufficient documentation

## 2022-02-11 DIAGNOSIS — I700 Atherosclerosis of aorta: Secondary | ICD-10-CM | POA: Insufficient documentation

## 2022-02-11 DIAGNOSIS — Z9221 Personal history of antineoplastic chemotherapy: Secondary | ICD-10-CM | POA: Insufficient documentation

## 2022-02-11 DIAGNOSIS — Z5111 Encounter for antineoplastic chemotherapy: Secondary | ICD-10-CM | POA: Insufficient documentation

## 2022-02-11 DIAGNOSIS — D649 Anemia, unspecified: Secondary | ICD-10-CM | POA: Insufficient documentation

## 2022-02-11 DIAGNOSIS — C679 Malignant neoplasm of bladder, unspecified: Secondary | ICD-10-CM | POA: Insufficient documentation

## 2022-02-11 DIAGNOSIS — Z79899 Other long term (current) drug therapy: Secondary | ICD-10-CM | POA: Insufficient documentation

## 2022-02-11 DIAGNOSIS — I2510 Atherosclerotic heart disease of native coronary artery without angina pectoris: Secondary | ICD-10-CM | POA: Insufficient documentation

## 2022-02-11 DIAGNOSIS — Z86718 Personal history of other venous thrombosis and embolism: Secondary | ICD-10-CM | POA: Insufficient documentation

## 2022-02-11 DIAGNOSIS — R0602 Shortness of breath: Secondary | ICD-10-CM | POA: Insufficient documentation

## 2022-02-11 DIAGNOSIS — Z7901 Long term (current) use of anticoagulants: Secondary | ICD-10-CM | POA: Insufficient documentation

## 2022-02-11 NOTE — Telephone Encounter (Signed)
PC to this patient's grandson by Almyra Free, [place] interpreter - informed grandson PET scan needs to be scheduled for this patient ASAP, Central Scheduling number given.  Also informed him patient has appointment with Dr Alen Blew on 02/18/22 & scan needs to be done prior.  He verbalizes understanding & will schedule PET.

## 2022-02-12 ENCOUNTER — Encounter (HOSPITAL_COMMUNITY)
Admission: RE | Admit: 2022-02-12 | Discharge: 2022-02-12 | Disposition: A | Discharge status: Home / Self Care | Payer: Medicare (Managed Care) | Source: Ambulatory Visit | Attending: Oncology | Admitting: Oncology

## 2022-02-12 DIAGNOSIS — C680 Malignant neoplasm of urethra: Secondary | ICD-10-CM | POA: Insufficient documentation

## 2022-02-12 DIAGNOSIS — N1330 Unspecified hydronephrosis: Secondary | ICD-10-CM | POA: Insufficient documentation

## 2022-02-12 DIAGNOSIS — R9349 Abnormal radiologic findings on diagnostic imaging of other urinary organs: Secondary | ICD-10-CM | POA: Insufficient documentation

## 2022-02-12 DIAGNOSIS — C679 Malignant neoplasm of bladder, unspecified: Secondary | ICD-10-CM | POA: Insufficient documentation

## 2022-02-12 DIAGNOSIS — R9389 Abnormal findings on diagnostic imaging of other specified body structures: Secondary | ICD-10-CM | POA: Insufficient documentation

## 2022-02-12 LAB — GLUCOSE, CAPILLARY: Glucose-Capillary: 132 mg/dL — ABNORMAL HIGH (ref 70–99)

## 2022-02-12 MED ORDER — FLUDEOXYGLUCOSE F - 18 (FDG) INJECTION
7.4000 | Freq: Once | INTRAVENOUS | Status: AC
  Administered 2022-02-12: 7.4 via INTRAVENOUS

## 2022-02-18 ENCOUNTER — Inpatient Hospital Stay (HOSPITAL_BASED_OUTPATIENT_CLINIC_OR_DEPARTMENT_OTHER): Payer: Medicare (Managed Care) | Admitting: Oncology

## 2022-02-18 VITALS — BP 189/95 | HR 85 | Temp 97.9°F | Resp 16 | Ht 66.0 in | Wt 151.3 lb

## 2022-02-18 DIAGNOSIS — Z7901 Long term (current) use of anticoagulants: Secondary | ICD-10-CM | POA: Diagnosis not present

## 2022-02-18 DIAGNOSIS — Z5111 Encounter for antineoplastic chemotherapy: Secondary | ICD-10-CM | POA: Diagnosis present

## 2022-02-18 DIAGNOSIS — D649 Anemia, unspecified: Secondary | ICD-10-CM | POA: Diagnosis not present

## 2022-02-18 DIAGNOSIS — C679 Malignant neoplasm of bladder, unspecified: Secondary | ICD-10-CM

## 2022-02-18 DIAGNOSIS — Z9221 Personal history of antineoplastic chemotherapy: Secondary | ICD-10-CM | POA: Diagnosis not present

## 2022-02-18 DIAGNOSIS — R599 Enlarged lymph nodes, unspecified: Secondary | ICD-10-CM | POA: Diagnosis not present

## 2022-02-18 DIAGNOSIS — I2510 Atherosclerotic heart disease of native coronary artery without angina pectoris: Secondary | ICD-10-CM | POA: Diagnosis not present

## 2022-02-18 DIAGNOSIS — I700 Atherosclerosis of aorta: Secondary | ICD-10-CM | POA: Diagnosis not present

## 2022-02-18 DIAGNOSIS — Z79899 Other long term (current) drug therapy: Secondary | ICD-10-CM | POA: Diagnosis not present

## 2022-02-18 DIAGNOSIS — Z86718 Personal history of other venous thrombosis and embolism: Secondary | ICD-10-CM | POA: Diagnosis not present

## 2022-02-18 DIAGNOSIS — R0602 Shortness of breath: Secondary | ICD-10-CM | POA: Diagnosis not present

## 2022-02-18 DIAGNOSIS — J90 Pleural effusion, not elsewhere classified: Secondary | ICD-10-CM | POA: Diagnosis not present

## 2022-02-18 NOTE — Progress Notes (Signed)
Hematology and Oncology Follow Up Visit  Terrence Rivera [id number] Feb 24, 1941 81 y.o. 02/18/2022 11:42 AM Terrence Rivera., MDFoster, Terrence Rivera., MD   Principle Diagnosis: 81 year old man with bladder cancer diagnosed in July 2023.  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    Prior Therapy:  He underwent cystoscopy and TURBT on September 23, 2021 which showed a large tumor involving the prostatic urethra with large volume disease around the ureteral orifices.   Neoadjuvant chemotherapy utilizing gemcitabine and cisplatin started on November 13, 2021.  Therapy has been interrupted due to hospitalization.  He completed 3 cycles of therapy with the last cycle of cisplatin was held due to kidney function.    Current therapy: Active surveillance and under consideration for additional therapy.  Interim History: [name] returns today for a follow-up.  Since last visit, he was seen by [facility] and that currently set up for both medical oncology as well as radiation oncology at [facility].  He does report occasional dyspnea on exertion and lower extremity edema but no other complaints.  He denies any hematuria, dysuria.  He denies excessive fatigue or tiredness.  His performance status quality of life remains at baseline and marginal.    Medications: Reviewed without changes. Current Outpatient Medications  Medication Sig Dispense Refill   acetaminophen (TYLENOL) 325 MG tablet Take 2 tablets (650 mg total) by mouth every 6 (six) hours as needed for mild pain (or Fever >/= 101).     APIXABAN (ELIQUIS) VTE STARTER PACK ('10MG'$  AND '5MG'$ ) Take as directed on package: start with two-'5mg'$  tablets twice daily for 7 days. On day 8, switch to one-'5mg'$  tablet twice daily. 74 each 0   lidocaine-prilocaine (EMLA) cream Apply 1 Application topically as needed. 30 g 0   lisinopril (ZESTRIL) 20 MG tablet Take 1  tablet (20 mg total) by mouth daily. 60 tablet 0   prochlorperazine (COMPAZINE) 10 MG tablet Take 1 tablet (10 mg total) by mouth every 6 (six) hours as needed for nausea or vomiting. 30 tablet 0   No current facility-administered medications for this visit.   Facility-Administered Medications Ordered in Other Visits  Medication Dose Route Frequency Provider Last Rate Last Admin   0.9 %  sodium chloride infusion   Intravenous Once Keigan Tafoya, Mathis Dad, MD         Allergies: No Known Allergies    Physical Exam:   Blood pressure (!) 189/95, pulse 85, temperature 97.9 F (36.6 C), temperature source Temporal, resp. rate 16, height '5\' 6"'$  (1.676 m), weight 151 lb 4.8 oz (68.6 kg), SpO2 100 %.   ECOG: 1   General appearance: Comfortable appearing without any discomfort Head: Normocephalic without any trauma Oropharynx: Mucous membranes are moist and pink without any thrush or ulcers. Eyes: Pupils are equal and round reactive to light. Lymph nodes: No cervical, supraclavicular, inguinal or axillary lymphadenopathy.   Heart:regular rate and rhythm.  S1 and S2 without leg edema. Lung: Clear without any rhonchi or wheezes.  No dullness to percussion. Abdomin: Soft, nontender, nondistended with good bowel sounds.  No hepatosplenomegaly. Musculoskeletal: No joint deformity or effusion.  Full range of motion noted. Neurological: No deficits noted on motor, sensory and deep tendon reflex exam. Skin: No petechial rash or dryness.  Appeared moist.         Lab Results: Lab Results  Component Value Date   WBC 4.4 01/27/2022   HGB 8.5 (L)  01/27/2022   HCT 26.6 (L) 01/27/2022   MCV 89.9 01/27/2022   PLT 137 (L) 01/27/2022     Chemistry      Component Value Date/Time   NA 139 01/27/2022 0832   K 3.5 01/27/2022 0832   CL 105 01/27/2022 0832   CO2 26 01/27/2022 0832   BUN 23 01/27/2022 0832   CREATININE 1.39 (H) 01/27/2022 0832   CREATININE 1.40 (H) 01/21/2022 1254      Component  Value Date/Time   CALCIUM 8.0 (L) 01/27/2022 0832   ALKPHOS 57 01/26/2022 0426   AST 35 01/26/2022 0426   AST 22 01/21/2022 1254   ALT 27 01/26/2022 0426   ALT 27 01/21/2022 1254   BILITOT 0.8 01/26/2022 0426   BILITOT 0.4 01/21/2022 1254       Radiological Studies:  Narrative & Impression  CLINICAL DATA:  Initial treatment strategy for history of bladder cancer, found to have mass involving the prosthetic urethra and large volume disease about the ureteral orifices based on review of recent clinic information.   EXAM: NUCLEAR MEDICINE PET SKULL BASE TO THIGH   TECHNIQUE: 7.39 mCi F-18 FDG was injected intravenously. Full-ring PET imaging was performed from the skull base to thigh after the radiotracer. CT data was obtained and used for attenuation correction and anatomic localization.   Fasting blood glucose: 132 mg/dl   COMPARISON:  No cross-sectional imaging is available for comparison within this medical record. Note is made of imaging from [facility] from Jul 07, 2021. This appears to represent the same patient with the same date of birth.   FINDINGS: Mediastinal blood pool activity: SUV max 2.43   Liver activity: SUV max NA   NECK: No hypermetabolic lymph nodes in the neck.   Incidental CT findings: None.   CHEST: No hypermetabolic foci in the chest. There is an enlarged precarinal lymph node (image 65/4) maximum SUV of 2.47. Scattered lymph nodes elsewhere throughout the chest without pathologic enlargement or increased metabolic activity.   Incidental CT findings: Motion limited assessment of lung parenchyma. Moderate bilateral pleural effusions. No discrete pulmonary nodule. Aortic atherosclerosis and coronary artery calcifications.   ABDOMEN/PELVIS: Bilateral hydroureteronephrosis. Thickening of the bladder base. Thickening preferentially involving the RIGHT bladder base near the ureterovesicular orifice 19 x 14 mm (image 167/4) discrete area  of increased metabolic activity on FDG PET is not appreciated currently within the urinary bladder though presence of concentrated FDG in the urine limits assessment. Hydroureteronephrosis is worse on the RIGHT. No enlarged lymph nodes or lymph nodes with increased metabolic activity in the abdomen or in the pelvis. No increased metabolic activity about the prostate grossly as well.   Incidental CT findings: Hydroureteronephrosis as discussed. Renal cysts bilaterally which require no dedicated imaging follow-up in the absence of localizing symptoms. No signs of bowel obstruction or acute bowel process. Appendix is normal. Colonic diverticulosis.   SKELETON: No focal hypermetabolic activity to suggest skeletal metastasis.   Incidental CT findings: Spinal degenerative changes.   IMPRESSION: 1. Thickening of the bladder base preferentially involving the RIGHT bladder base near the ureterovesicular orifice, may reflect a site of primary tumor, no gross marked increased metabolic activity in this location, assessment limited by concentrated urine. 2. Above process causes hydroureteronephrosis, moderate to marked on the RIGHT and moderate on the LEFT, a new finding since previous imaging. 3. No definitive signs of distant metastatic disease either nodal or solid organ disease. 4. Enlarged precarinal lymph node is of uncertain significance, would be an  unusual location for isolated metastatic disease but warrants attention on follow-up. Metabolic activity is very close to, only minimally greater than mediastinal blood pool. 5. Bilateral pleural effusions moderate and new since previous imaging and associated with basilar atelectasis. There is also septal thickening in this patient with coronary artery disease. Some of this was present on previous imaging though appearing more pronounced on the current study. Correlate with any symptoms that would suggest ongoing heart failure or volume  overload. 6. Aortic atherosclerosis and coronary artery disease.     Impression and Plan:  81 year old with:     1.  Bladder cancer diagnosed in May 2023.  He was found to have T3N0 high-grade urothelial carcinoma.   He received neoadjuvant chemotherapy with marginal tolerance and borderline kidney function.  PET scan obtained on February 12, 2022 showed no evidence of metastatic disease.  Treatment options moving forward were reiterated including radical cystectomy versus trimodality approach with chemotherapy and radiation after maximum TURBT.  Due to insurance purposes, he needed to establish care with urology at [facility].  He is also set up to have a medical oncology as well as radiation oncology evaluation there.  I recommended that he continue his care there moving forward given the complexity of his case and it would be reasonable to receive any additional medical oncology care there.  I also informed him that I am leaving at [facility] personally and he has to establish care with a different medical oncologist if he wants to stay locally.  After discussion, he will follow-up with Dr. Roslyn Smiling on his scheduled appointment on 12/19 and potentially establish care there.     2.  IV access: Port-A-Cath remains in place and can be continued in use for any future chemotherapy treatments.  3.  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  No recent thrombosis or bleeding noted.   4.  Renal insufficiency: His creatinine clearance around 50 cc/min.  No further decline noted.  6.  Dyspnea: Likely related to cardiac etiology.  He does have pleural effusion and I recommended follow-up with cardiology.  7.  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  He does not require any packed red cell transfusion at this time.   8.  Follow-up: Will be at at [place] to continue his oncology care.   30  minutes  were dedicated to this visit.  The time was spent on updating disease status, treatment choices and outlining future plan of care review.       Zola Button, MD 12/14/202311:42 AM

## 2022-03-28 ENCOUNTER — Other Ambulatory Visit: Payer: Self-pay

## 2022-03-29 ENCOUNTER — Other Ambulatory Visit (HOSPITAL_COMMUNITY): Payer: Self-pay

## 2022-07-14 ENCOUNTER — Other Ambulatory Visit: Payer: Self-pay

## 2022-10-04 ENCOUNTER — Telehealth: Payer: Self-pay | Admitting: *Deleted

## 2022-10-04 NOTE — Telephone Encounter (Signed)
Today this nurse received [employer] form for [employer], [birth year], claim no.#: [id number].  Connected with him [phone]) upon noting last seen by Dr. Clelia Croft on 02/18/2022.  Patient to establish care at [facility] for Medical and radiation oncology per insurance purposes.  No follow up with [facility].  Confirms currently under care of Dr. Lamar Benes and Judie Petit. Darrold Junker, PA-C with [facility].  Most recent visit was last month.  Provided [employer] phone number [phone] and claim number: [id number] to notify them of caregiver.  Denies further questions or needs.  This nurse returned form to new [employer] via fax no.#: [phone].

## 2022-10-05 ENCOUNTER — Other Ambulatory Visit: Payer: Self-pay

## 2023-01-13 ENCOUNTER — Other Ambulatory Visit: Payer: Self-pay | Admitting: Physician Assistant

## 2023-01-13 DIAGNOSIS — H903 Sensorineural hearing loss, bilateral: Secondary | ICD-10-CM

## 2023-01-29 ENCOUNTER — Other Ambulatory Visit: Payer: Self-pay

## 2023-08-14 ENCOUNTER — Emergency Department (HOSPITAL_COMMUNITY)

## 2023-08-14 ENCOUNTER — Encounter (HOSPITAL_COMMUNITY): Payer: Self-pay

## 2023-08-14 ENCOUNTER — Encounter: Payer: Self-pay | Admitting: Oncology

## 2023-08-14 ENCOUNTER — Other Ambulatory Visit: Payer: Self-pay

## 2023-08-14 ENCOUNTER — Emergency Department (HOSPITAL_COMMUNITY)
Admission: EM | Admit: 2023-08-14 | Discharge: 2023-08-14 | Disposition: A | Discharge status: Home / Self Care | Attending: Emergency Medicine | Admitting: Emergency Medicine

## 2023-08-14 DIAGNOSIS — Z7901 Long term (current) use of anticoagulants: Secondary | ICD-10-CM | POA: Insufficient documentation

## 2023-08-14 DIAGNOSIS — G510 Bell's palsy: Secondary | ICD-10-CM

## 2023-08-14 DIAGNOSIS — R29810 Facial weakness: Secondary | ICD-10-CM | POA: Diagnosis present

## 2023-08-14 DIAGNOSIS — Z79899 Other long term (current) drug therapy: Secondary | ICD-10-CM | POA: Insufficient documentation

## 2023-08-14 DIAGNOSIS — N179 Acute kidney failure, unspecified: Secondary | ICD-10-CM | POA: Diagnosis not present

## 2023-08-14 LAB — I-STAT CHEM 8, ED
BUN: 31 mg/dL — ABNORMAL HIGH (ref 8–23)
Calcium, Ion: 1.15 mmol/L (ref 1.15–1.40)
Chloride: 104 mmol/L (ref 98–111)
Creatinine, Ser: 2.4 mg/dL — ABNORMAL HIGH (ref 0.61–1.24)
Glucose, Bld: 150 mg/dL — ABNORMAL HIGH (ref 70–99)
HCT: 33 % — ABNORMAL LOW (ref 39.0–52.0)
Hemoglobin: 11.2 g/dL — ABNORMAL LOW (ref 13.0–17.0)
Potassium: 3.6 mmol/L (ref 3.5–5.1)
Sodium: 139 mmol/L (ref 135–145)
TCO2: 23 mmol/L (ref 22–32)

## 2023-08-14 LAB — PROTIME-INR
INR: 1.1 (ref 0.8–1.2)
Prothrombin Time: 14.8 s (ref 11.4–15.2)

## 2023-08-14 LAB — CBC
HCT: 34.3 % — ABNORMAL LOW (ref 39.0–52.0)
Hemoglobin: 10.5 g/dL — ABNORMAL LOW (ref 13.0–17.0)
MCH: 27.2 pg (ref 26.0–34.0)
MCHC: 30.6 g/dL (ref 30.0–36.0)
MCV: 88.9 fL (ref 80.0–100.0)
Platelets: 235 10*3/uL (ref 150–400)
RBC: 3.86 MIL/uL — ABNORMAL LOW (ref 4.22–5.81)
RDW: 19.6 % — ABNORMAL HIGH (ref 11.5–15.5)
WBC: 10.1 10*3/uL (ref 4.0–10.5)
nRBC: 0 % (ref 0.0–0.2)

## 2023-08-14 LAB — ETHANOL: Alcohol, Ethyl (B): <15 mg/dL (ref <15)

## 2023-08-14 LAB — DIFFERENTIAL
Abs Immature Granulocytes: 0.03 10*3/uL (ref 0.00–0.07)
Basophils Absolute: 0.1 10*3/uL (ref 0.0–0.1)
Basophils Relative: 1 %
Eosinophils Absolute: 1.1 10*3/uL — ABNORMAL HIGH (ref 0.0–0.5)
Eosinophils Relative: 11 %
Immature Granulocytes: 0 %
Lymphocytes Relative: 27 %
Lymphs Abs: 2.8 10*3/uL (ref 0.7–4.0)
Monocytes Absolute: 0.9 10*3/uL (ref 0.1–1.0)
Monocytes Relative: 9 %
Neutro Abs: 5.3 10*3/uL (ref 1.7–7.7)
Neutrophils Relative %: 52 %

## 2023-08-14 LAB — COMPREHENSIVE METABOLIC PANEL WITH GFR
ALT: 18 U/L (ref 0–44)
AST: 25 U/L (ref 15–41)
Albumin: 3 g/dL — ABNORMAL LOW (ref 3.5–5.0)
Alkaline Phosphatase: 78 U/L (ref 38–126)
Anion gap: 7 (ref 5–15)
BUN: 36 mg/dL — ABNORMAL HIGH (ref 8–23)
CO2: 23 mmol/L (ref 22–32)
Calcium: 8.7 mg/dL — ABNORMAL LOW (ref 8.9–10.3)
Chloride: 105 mmol/L (ref 98–111)
Creatinine, Ser: 2.45 mg/dL — ABNORMAL HIGH (ref 0.61–1.24)
GFR, Estimated: 26 mL/min — ABNORMAL LOW (ref >60)
Glucose, Bld: 155 mg/dL — ABNORMAL HIGH (ref 70–99)
Potassium: 3.5 mmol/L (ref 3.5–5.1)
Sodium: 135 mmol/L (ref 135–145)
Total Bilirubin: 0.6 mg/dL (ref 0.0–1.2)
Total Protein: 8.8 g/dL — ABNORMAL HIGH (ref 6.5–8.1)

## 2023-08-14 LAB — CBG MONITORING, ED: Glucose-Capillary: 131 mg/dL — ABNORMAL HIGH (ref 70–99)

## 2023-08-14 LAB — APTT: aPTT: 37 s — ABNORMAL HIGH (ref 24–36)

## 2023-08-14 MED ORDER — VALACYCLOVIR HCL 1 G PO TABS: 1000.0000 mg | ORAL_TABLET | Freq: Two times a day (BID) | ORAL | 0 refills | Status: AC

## 2023-08-14 MED ORDER — HEPARIN SOD (PORK) LOCK FLUSH 100 UNIT/ML IV SOLN
500.0000 [IU] | Freq: Once | INTRAVENOUS | Status: AC
  Administered 2023-08-14: 500 [IU]
  Filled 2023-08-14: qty 5

## 2023-08-14 MED ORDER — SODIUM CHLORIDE 0.9% FLUSH: 3.0000 mL | Freq: Once | INTRAVENOUS | Status: DC

## 2023-08-14 MED ORDER — PREDNISONE 50 MG PO TABS: ORAL_TABLET | ORAL | 0 refills | Status: AC

## 2023-08-14 NOTE — ED Triage Notes (Signed)
 Spanish interpretor used: Pt reports he woke up from a nap yesterday around 3-4pm. States he noticed he had right sided facial droop. Pt arrives AxOx4. No other focal neurological deficits noted.

## 2023-08-14 NOTE — ED Provider Notes (Addendum)
 [place] EMERGENCY DEPARTMENT AT [facility] Provider Note   CSN: [id number] Arrival date & time: 08/14/23  1140     History  Chief Complaint  Patient presents with   Facial Droop    Terrence Rivera is a 83 y.o. male.  83 year old male who presents with right-sided facial droop which has been going on since yesterday afternoon.  States that he had trouble closing his right eye.  Denies any trouble with taste.  No recent ear pain or rashes.  No weakness in his arms or legs.  Does not feel ataxic when he walks.  States he cannot wrinkle his forehead.  No prior history of same.  Video interpreter used for this encounter       Home Medications Prior to Admission medications   Medication Sig Start Date End Date Taking? Authorizing Provider  acetaminophen  (TYLENOL ) 325 MG tablet Take 2 tablets (650 mg total) by mouth every 6 (six) hours as needed for mild pain (or Fever >/= 101). 01/27/22   Armenta Landau, MD  APIXABAN  (ELIQUIS ) VTE STARTER PACK (10MG  AND 5MG ) Take as directed on package: start with two-5mg  tablets twice daily for 7 days. On day 8, switch to one-5mg  tablet twice daily. 01/27/22   Armenta Landau, MD  lidocaine -prilocaine  (EMLA ) cream Apply 1 Application topically as needed. 10/30/21   Shadad, Firas N, MD  lisinopril  (ZESTRIL ) 20 MG tablet Take 1 tablet (20 mg total) by mouth daily. 02/03/22   Armenta Landau, MD  prochlorperazine  (COMPAZINE ) 10 MG tablet Take 1 tablet (10 mg total) by mouth every 6 (six) hours as needed for nausea or vomiting. 10/30/21   Renna Cary, MD      Allergies    Patient has no known allergies.    Review of Systems   Review of Systems  All other systems reviewed and are negative.   Physical Exam Updated Vital Signs BP (!) 180/121   Pulse (!) 50   Temp 98.7 F (37.1 C) (Oral)   Resp 20   SpO2 100%  Physical Exam Vitals and nursing note reviewed.  Constitutional:      General: He is not in acute  distress.    Appearance: Normal appearance. He is well-developed. He is not toxic-appearing.  HENT:     Head: Normocephalic and atraumatic.  Eyes:     General: Lids are normal.     Conjunctiva/sclera: Conjunctivae normal.     Pupils: Pupils are equal, round, and reactive to light.  Neck:     Thyroid: No thyroid mass.     Trachea: No tracheal deviation.  Cardiovascular:     Rate and Rhythm: Normal rate and regular rhythm.     Heart sounds: Normal heart sounds. No murmur heard.    No gallop.  Pulmonary:     Effort: Pulmonary effort is normal. No respiratory distress.     Breath sounds: Normal breath sounds. No stridor. No decreased breath sounds, wheezing, rhonchi or rales.  Abdominal:     General: There is no distension.     Palpations: Abdomen is soft.     Tenderness: There is no abdominal tenderness. There is no rebound.  Musculoskeletal:        General: No tenderness. Normal range of motion.     Cervical back: Normal range of motion and neck supple.  Skin:    General: Skin is warm and dry.     Findings: No abrasion or rash.  Neurological:  Mental Status: He is alert and oriented to person, place, and time. Mental status is at baseline.     GCS: GCS eye subscore is 4. GCS verbal subscore is 5. GCS motor subscore is 6.     Cranial Nerves: Cranial nerve deficit present.     Sensory: Sensory deficit present.     Motor: Motor function is intact.     Comments: No pronator drift noted.  Forehead muscles on the right side are not spared.  Trouble closing his eye on the right side  Psychiatric:        Attention and Perception: Attention normal.        Speech: Speech normal.        Behavior: Behavior normal.     ED Results / Procedures / Treatments   Labs (all labs ordered are listed, but only abnormal results are displayed) Labs Reviewed  CBG MONITORING, ED - Abnormal; Notable for the following components:      Result Value   Glucose-Capillary 131 (*)    All other  components within normal limits  PROTIME-INR  APTT  CBC  DIFFERENTIAL  COMPREHENSIVE METABOLIC PANEL WITH GFR  ETHANOL  I-STAT CHEM 8, ED    EKG EKG Interpretation Date/Time:  Sunday August 14 2023 11:48:55 EDT Ventricular Rate:  96 PR Interval:  140 QRS Duration:  97 QT Interval:  341 QTC Calculation: 431 R Axis:   43  Text Interpretation: Sinus tachycardia Multiform ventricular premature complexes Abnormal R-wave progression, early transition LVH with secondary repolarization abnormality Confirmed by Lind Repine ([id number]) on 08/14/2023 12:21:31 PM  Radiology CT HEAD WO CONTRAST Result Date: 08/14/2023 CLINICAL DATA:  Neuro deficit, acute, stroke suspected Spanish interpreter used: Pt reports he woke up from a nap yesterday around 3-4pm. States he noticed he had right sided facial droop. Pt arrives AxOx4. No other focal neurological deficits noted. EXAM: CT HEAD WITHOUT CONTRAST TECHNIQUE: Contiguous axial images were obtained from the base of the skull through the vertex without intravenous contrast. RADIATION DOSE REDUCTION: This exam was performed according to the departmental dose-optimization program which includes automated exposure control, adjustment of the mA and/or kV according to patient size and/or use of iterative reconstruction technique. COMPARISON:  None Available. FINDINGS: Brain: Patchy and confluent areas of decreased attenuation are noted throughout the deep and periventricular white matter of the cerebral hemispheres bilaterally, compatible with chronic microvascular ischemic disease. Chronic right basal ganglia infarction. No evidence of large-territorial acute infarction. No parenchymal hemorrhage. No mass lesion. No extra-axial collection. No mass effect or midline shift. No hydrocephalus. Basilar cisterns are patent. Vascular: No hyperdense vessel. Atherosclerotic calcifications are present within the cavernous internal carotid arteries. Skull: No acute fracture or focal  lesion. Sinuses/Orbits: Paranasal sinuses and mastoid air cells are clear. The orbits are unremarkable. Other: None. IMPRESSION: No acute intracranial abnormality. Electronically Signed   By: Morgane  Naveau M.D.   On: 08/14/2023 12:17    Procedures Procedures    Medications Ordered in ED Medications  sodium chloride  flush (NS) 0.9 % injection 3 mL (has no administration in time range)    ED Course/ Medical Decision Making/ A&P                                 Medical Decision Making Amount and/or Complexity of Data Reviewed Labs: ordered. Radiology: ordered.   Patient's EKG shows sinus tachycardia.  Head CT without acute findings.  Suspect that he has  Bell's palsy.  Patient however has evidence of acute kidney injury.  Discussed with Dr. Jonah Negus from nephrology who recommends admission.  Will consult hospitalist team  3:21 PM She has known history of renal stents and had a renal CT which showed chronic bilateral hydronephrosis.  Patient has good urine output and his collection bag.  Due to patient's acute kidney injury, I wanted to admit the patient.  Patient is refusing admission at this time.  I explained to him that this could progress to kidney failure.  He understands this.  A video interpreter was used to have this conversation.  Patient's Bell's palsy will be treated with prednisone  and renal dosed acyclovir.  Patient encouraged to return if he changes mind  CRITICAL CARE Performed by: Eden Goodpasture Total critical care time: 45 minutes Critical care time was exclusive of separately billable procedures and treating other patients. Critical care was necessary to treat or prevent imminent or life-threatening deterioration. Critical care was time spent personally by me on the following activities: development of treatment plan with patient and/or surrogate as well as nursing, discussions with consultants, evaluation of patient's response to treatment, examination of patient,  obtaining history from patient or surrogate, ordering and performing treatments and interventions, ordering and review of laboratory studies, ordering and review of radiographic studies, pulse oximetry and re-evaluation of patient's condition.         Final Clinical Impression(s) / ED Diagnoses Final diagnoses:  None    Rx / DC Orders ED Discharge Orders     None         Lind Repine, MD 08/14/23 1409    Lind Repine, MD 08/14/23 1524

## 2023-08-15 ENCOUNTER — Other Ambulatory Visit: Payer: Self-pay
